# Patient Record
Sex: Male | Born: 1935 | Race: White | Hispanic: No | Smoking: Former smoker
Health system: Southern US, Community
[De-identification: ages and names within clinical notes are randomized; demographics above are authoritative.]

## PROBLEM LIST (undated history)

## (undated) DIAGNOSIS — R062 Wheezing: Secondary | ICD-10-CM

## (undated) DIAGNOSIS — R17 Unspecified jaundice: Secondary | ICD-10-CM

## (undated) DIAGNOSIS — E119 Type 2 diabetes mellitus without complications: Secondary | ICD-10-CM

## (undated) DIAGNOSIS — R778 Other specified abnormalities of plasma proteins: Secondary | ICD-10-CM

## (undated) DIAGNOSIS — R7989 Other specified abnormal findings of blood chemistry: Secondary | ICD-10-CM

## (undated) DIAGNOSIS — J189 Pneumonia, unspecified organism: Secondary | ICD-10-CM

## (undated) DIAGNOSIS — G9340 Encephalopathy, unspecified: Secondary | ICD-10-CM

## (undated) DIAGNOSIS — G629 Polyneuropathy, unspecified: Secondary | ICD-10-CM

## (undated) DIAGNOSIS — M545 Low back pain, unspecified: Secondary | ICD-10-CM

## (undated) DIAGNOSIS — I639 Cerebral infarction, unspecified: Secondary | ICD-10-CM

## (undated) DIAGNOSIS — E039 Hypothyroidism, unspecified: Secondary | ICD-10-CM

## (undated) DIAGNOSIS — C801 Malignant (primary) neoplasm, unspecified: Secondary | ICD-10-CM

## (undated) DIAGNOSIS — I219 Acute myocardial infarction, unspecified: Secondary | ICD-10-CM

## (undated) DIAGNOSIS — K219 Gastro-esophageal reflux disease without esophagitis: Secondary | ICD-10-CM

## (undated) DIAGNOSIS — M1712 Unilateral primary osteoarthritis, left knee: Secondary | ICD-10-CM

## (undated) DIAGNOSIS — F329 Major depressive disorder, single episode, unspecified: Secondary | ICD-10-CM

## (undated) DIAGNOSIS — F32A Depression, unspecified: Secondary | ICD-10-CM

## (undated) DIAGNOSIS — D649 Anemia, unspecified: Secondary | ICD-10-CM

## (undated) DIAGNOSIS — I10 Essential (primary) hypertension: Secondary | ICD-10-CM

## (undated) DIAGNOSIS — G8929 Other chronic pain: Secondary | ICD-10-CM

## (undated) DIAGNOSIS — I2510 Atherosclerotic heart disease of native coronary artery without angina pectoris: Secondary | ICD-10-CM

## (undated) HISTORY — DX: Cerebral infarction, unspecified: I63.9

## (undated) HISTORY — DX: Low back pain: M54.5

## (undated) HISTORY — DX: Atherosclerotic heart disease of native coronary artery without angina pectoris: I25.10

## (undated) HISTORY — DX: Unilateral primary osteoarthritis, left knee: M17.12

## (undated) HISTORY — DX: Essential (primary) hypertension: I10

## (undated) HISTORY — DX: Hypothyroidism, unspecified: E03.9

## (undated) HISTORY — DX: Acute myocardial infarction, unspecified: I21.9

## (undated) HISTORY — DX: Type 2 diabetes mellitus without complications: E11.9

## (undated) HISTORY — DX: Other chronic pain: G89.29

## (undated) HISTORY — DX: Low back pain, unspecified: M54.50

## (undated) HISTORY — DX: Polyneuropathy, unspecified: G62.9

## (undated) HISTORY — DX: Gastro-esophageal reflux disease without esophagitis: K21.9

---
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
## 1967-07-13 HISTORY — PX: KNEE ARTHROTOMY: SHX5881

## 1972-07-12 HISTORY — PX: BACK SURGERY: SHX140

## 1974-07-12 HISTORY — PX: HEMORRHOID SURGERY: SHX153

## 1987-07-13 DIAGNOSIS — I219 Acute myocardial infarction, unspecified: Secondary | ICD-10-CM

## 1987-07-13 HISTORY — DX: Acute myocardial infarction, unspecified: I21.9

## 1990-07-12 HISTORY — PX: STOMACH SURGERY: SHX791

## 1994-07-12 HISTORY — PX: CORONARY ARTERY BYPASS GRAFT: SHX141

## 1998-07-12 HISTORY — PX: CHOLECYSTECTOMY: SHX55

## 1998-10-14 ENCOUNTER — Encounter: Payer: Self-pay | Admitting: Internal Medicine

## 1998-10-14 ENCOUNTER — Inpatient Hospital Stay (HOSPITAL_COMMUNITY): Admission: EM | Admit: 1998-10-14 | Discharge: 1998-10-21 | Payer: Self-pay | Admitting: Emergency Medicine

## 1998-10-15 ENCOUNTER — Encounter: Payer: Self-pay | Admitting: Internal Medicine

## 1998-10-17 ENCOUNTER — Encounter: Payer: Self-pay | Admitting: Internal Medicine

## 1999-08-21 ENCOUNTER — Ambulatory Visit: Admission: RE | Admit: 1999-08-21 | Discharge: 1999-08-21 | Payer: Self-pay | Admitting: Internal Medicine

## 2000-07-12 DIAGNOSIS — I639 Cerebral infarction, unspecified: Secondary | ICD-10-CM

## 2000-07-12 HISTORY — DX: Cerebral infarction, unspecified: I63.9

## 2002-10-09 ENCOUNTER — Encounter: Payer: Self-pay | Admitting: Neurology

## 2002-10-09 ENCOUNTER — Ambulatory Visit (HOSPITAL_COMMUNITY): Admission: RE | Admit: 2002-10-09 | Discharge: 2002-10-09 | Payer: Self-pay | Admitting: Neurology

## 2003-05-29 ENCOUNTER — Encounter
Admission: RE | Admit: 2003-05-29 | Discharge: 2003-05-29 | Payer: Self-pay | Admitting: Physical Medicine and Rehabilitation

## 2004-05-29 ENCOUNTER — Encounter
Admission: RE | Admit: 2004-05-29 | Discharge: 2004-05-29 | Payer: Self-pay | Admitting: Physical Medicine and Rehabilitation

## 2005-11-08 ENCOUNTER — Ambulatory Visit: Payer: Self-pay | Admitting: Oncology

## 2006-02-04 ENCOUNTER — Encounter: Admission: RE | Admit: 2006-02-04 | Discharge: 2006-02-04 | Payer: Self-pay | Admitting: Neurology

## 2006-02-15 ENCOUNTER — Ambulatory Visit: Payer: Self-pay | Admitting: Oncology

## 2006-08-02 ENCOUNTER — Ambulatory Visit: Payer: Self-pay | Admitting: Oncology

## 2007-01-24 ENCOUNTER — Ambulatory Visit: Payer: Self-pay | Admitting: Oncology

## 2009-01-15 ENCOUNTER — Encounter (INDEPENDENT_AMBULATORY_CARE_PROVIDER_SITE_OTHER): Payer: Self-pay | Admitting: Oncology

## 2009-01-15 ENCOUNTER — Other Ambulatory Visit: Admission: RE | Admit: 2009-01-15 | Discharge: 2009-01-15 | Payer: Self-pay | Admitting: Oncology

## 2011-03-06 ENCOUNTER — Emergency Department (HOSPITAL_COMMUNITY): Payer: Medicare Other

## 2011-03-06 ENCOUNTER — Emergency Department (HOSPITAL_COMMUNITY)
Admission: EM | Admit: 2011-03-06 | Discharge: 2011-03-06 | Disposition: A | Discharge status: Home / Self Care | Payer: Medicare Other | Attending: Emergency Medicine | Admitting: Emergency Medicine

## 2011-03-06 DIAGNOSIS — M545 Low back pain, unspecified: Secondary | ICD-10-CM | POA: Insufficient documentation

## 2011-03-06 DIAGNOSIS — E785 Hyperlipidemia, unspecified: Secondary | ICD-10-CM | POA: Insufficient documentation

## 2011-03-06 DIAGNOSIS — E119 Type 2 diabetes mellitus without complications: Secondary | ICD-10-CM | POA: Insufficient documentation

## 2011-03-06 DIAGNOSIS — R209 Unspecified disturbances of skin sensation: Secondary | ICD-10-CM | POA: Insufficient documentation

## 2011-03-06 DIAGNOSIS — M546 Pain in thoracic spine: Secondary | ICD-10-CM | POA: Insufficient documentation

## 2011-03-06 DIAGNOSIS — R109 Unspecified abdominal pain: Secondary | ICD-10-CM | POA: Insufficient documentation

## 2011-03-06 DIAGNOSIS — E039 Hypothyroidism, unspecified: Secondary | ICD-10-CM | POA: Insufficient documentation

## 2011-03-06 DIAGNOSIS — Z79899 Other long term (current) drug therapy: Secondary | ICD-10-CM | POA: Insufficient documentation

## 2011-03-06 DIAGNOSIS — R079 Chest pain, unspecified: Secondary | ICD-10-CM | POA: Insufficient documentation

## 2011-03-06 LAB — CBC
MCH: 29.5 pg (ref 26.0–34.0)
MCHC: 33.7 g/dL (ref 30.0–36.0)
Platelets: 93 10*3/uL — ABNORMAL LOW (ref 150–400)
RDW: 17.2 % — ABNORMAL HIGH (ref 11.5–15.5)

## 2011-03-06 LAB — DIFFERENTIAL
Basophils Absolute: 0 10*3/uL (ref 0.0–0.1)
Basophils Relative: 0 % (ref 0–1)
Eosinophils Absolute: 0.1 10*3/uL (ref 0.0–0.7)
Eosinophils Relative: 2 % (ref 0–5)
Monocytes Absolute: 0.8 10*3/uL (ref 0.1–1.0)
Monocytes Relative: 17 % — ABNORMAL HIGH (ref 3–12)
Neutro Abs: 3 10*3/uL (ref 1.7–7.7)

## 2011-03-06 LAB — BASIC METABOLIC PANEL
Calcium: 8.8 mg/dL (ref 8.4–10.5)
GFR calc Af Amer: >60 mL/min (ref >60)
GFR calc non Af Amer: >60 mL/min (ref >60)
Potassium: 3.9 mEq/L (ref 3.5–5.1)
Sodium: 139 mEq/L (ref 135–145)

## 2011-03-06 LAB — APTT: aPTT: 31 seconds (ref 24–37)

## 2011-03-06 LAB — PROTIME-INR: INR: 1.21 (ref 0.00–1.49)

## 2011-03-06 MED ORDER — IOHEXOL 300 MG/ML  SOLN: 100.0000 mL | Freq: Once | INTRAMUSCULAR | Status: DC | PRN

## 2011-03-06 MED ORDER — IOHEXOL 300 MG/ML  SOLN
100.0000 mL | Freq: Once | INTRAMUSCULAR | Status: AC | PRN
  Administered 2011-03-06: 100 mL via INTRAVENOUS

## 2011-12-08 ENCOUNTER — Other Ambulatory Visit (HOSPITAL_COMMUNITY)
Admission: RE | Admit: 2011-12-08 | Discharge: 2011-12-08 | Disposition: A | Discharge status: Home / Self Care | Payer: Medicare Other | Source: Ambulatory Visit | Attending: Oncology | Admitting: Oncology

## 2011-12-08 DIAGNOSIS — D61818 Other pancytopenia: Secondary | ICD-10-CM | POA: Insufficient documentation

## 2011-12-08 DIAGNOSIS — D704 Cyclic neutropenia: Secondary | ICD-10-CM | POA: Insufficient documentation

## 2012-10-17 ENCOUNTER — Encounter (INDEPENDENT_AMBULATORY_CARE_PROVIDER_SITE_OTHER): Payer: Self-pay | Admitting: Ophthalmology

## 2012-10-20 ENCOUNTER — Encounter (INDEPENDENT_AMBULATORY_CARE_PROVIDER_SITE_OTHER): Payer: Medicare Other | Admitting: Ophthalmology

## 2012-10-20 DIAGNOSIS — H43819 Vitreous degeneration, unspecified eye: Secondary | ICD-10-CM

## 2012-10-20 DIAGNOSIS — H353290 Exudative age-related macular degeneration, unspecified eye, stage unspecified: Secondary | ICD-10-CM

## 2012-10-20 DIAGNOSIS — H3530 Unspecified macular degeneration: Secondary | ICD-10-CM

## 2012-11-07 ENCOUNTER — Encounter (HOSPITAL_COMMUNITY): Payer: Self-pay | Admitting: Pharmacist

## 2012-11-08 ENCOUNTER — Other Ambulatory Visit: Payer: Self-pay | Admitting: Physician Assistant

## 2012-11-08 ENCOUNTER — Encounter: Payer: Self-pay | Admitting: Physician Assistant

## 2012-11-08 DIAGNOSIS — I219 Acute myocardial infarction, unspecified: Secondary | ICD-10-CM | POA: Insufficient documentation

## 2012-11-08 DIAGNOSIS — G629 Polyneuropathy, unspecified: Secondary | ICD-10-CM | POA: Insufficient documentation

## 2012-11-08 DIAGNOSIS — E039 Hypothyroidism, unspecified: Secondary | ICD-10-CM | POA: Insufficient documentation

## 2012-11-08 DIAGNOSIS — G8929 Other chronic pain: Secondary | ICD-10-CM | POA: Insufficient documentation

## 2012-11-08 DIAGNOSIS — I2510 Atherosclerotic heart disease of native coronary artery without angina pectoris: Secondary | ICD-10-CM | POA: Insufficient documentation

## 2012-11-08 DIAGNOSIS — I639 Cerebral infarction, unspecified: Secondary | ICD-10-CM

## 2012-11-08 DIAGNOSIS — I10 Essential (primary) hypertension: Secondary | ICD-10-CM | POA: Insufficient documentation

## 2012-11-08 DIAGNOSIS — M1712 Unilateral primary osteoarthritis, left knee: Secondary | ICD-10-CM

## 2012-11-08 DIAGNOSIS — K219 Gastro-esophageal reflux disease without esophagitis: Secondary | ICD-10-CM | POA: Insufficient documentation

## 2012-11-08 NOTE — H&P (Signed)
TOTAL KNEE ADMISSION H&P  Patient is being admitted for left total knee arthroplasty.  Subjective:  Chief Complaint:left knee pain.  HPI: Ian Cruz, 79 y.o. male, has a history of pain and functional disability in the left knee due to arthritis and has failed non-surgical conservative treatments for greater than 12 weeks to includeNSAID's and/or analgesics, corticosteriod injections, flexibility and strengthening excercises, use of assistive devices, weight reduction as appropriate and activity modification.  Onset of symptoms was gradual, starting >10 years ago with gradually worsening course since that time. The patient noted prior procedures on the knee to include  menisectomy on the left knee(s).  Patient currently rates pain in the left knee(s) at 10 out of 10 with activity. Patient has night pain, worsening of pain with activity and weight bearing, pain that interferes with activities of daily living, crepitus and joint swelling.  Patient has evidence of subchondral sclerosis, periarticular osteophytes and joint space narrowing by imaging studies.  There is no active infection.  Patient Active Problem List   Diagnosis Date Noted  . Coronary artery disease   . Hypertension   . GERD (gastroesophageal reflux disease)   . Hypothyroid   . Stroke   . MI (myocardial infarction)   . Left knee DJD   . Neuropathy   . Chronic low back pain    Past Medical History  Diagnosis Date  . Diabetes   . Coronary artery disease   . Hypertension   . GERD (gastroesophageal reflux disease)   . Hypothyroid   . Stroke 2002  . MI (myocardial infarction) 1989  . Left knee DJD   . Neuropathy   . Chronic low back pain     Past Surgical History  Procedure Laterality Date  . Appendectomy  1946  . Back surgery  1974  . Hemorrhoid surgery  1976  . Stomach surgery  1992    cancer  . Coronary artery bypass graft  1996    3 vessel  . Cholecystectomy  2000  . Knee arthrotomy  1969    left knee  meniscus     (Not in a hospital admission) No Known Allergies   Current Outpatient Prescriptions on File Prior to Visit  Medication Sig Dispense Refill  . buPROPion (WELLBUTRIN XL) 150 MG 24 hr tablet Take 150 mg by mouth daily.      . celecoxib (CELEBREX) 200 MG capsule Take 200 mg by mouth 2 (two) times daily.      . clopidogrel (PLAVIX) 75 MG tablet Take 75 mg by mouth daily.      . ferrous sulfate 325 (65 FE) MG EC tablet Take 325 mg by mouth daily.      . finasteride (PROSCAR) 5 MG tablet Take 5 mg by mouth daily.      . furosemide (LASIX) 40 MG tablet Take 20-40 mg by mouth daily. Takes 20mg  one day, 40mg  the next, alternating days      . glimepiride (AMARYL) 2 MG tablet Take 2 mg by mouth daily before breakfast.      . levothyroxine (SYNTHROID, LEVOTHROID) 137 MCG tablet Take 137 mcg by mouth daily before breakfast.      . metFORMIN (GLUCOPHAGE) 500 MG tablet Take 500 mg by mouth 2 (two) times daily with a meal.      . metoprolol (LOPRESSOR) 50 MG tablet Take 50 mg by mouth daily.      . pantoprazole (PROTONIX) 40 MG tablet Take 40 mg by mouth daily.      Marland Kitchen  potassium chloride (K-DUR) 10 MEQ tablet Take 10 mEq by mouth daily.       No current facility-administered medications on file prior to visit.     History  Substance Use Topics  . Smoking status: Not on file  . Smokeless tobacco: Not on file  . Alcohol Use: Not on file    Family History  Problem Relation Age of Onset  . Heart disease Mother   . Heart disease Father   . Heart attack Father   . Hypertension Mother   . Stroke Mother   . Diabetes Father      Review of Systems  Constitutional: Negative.   HENT:       Dentures   Eyes: Positive for blurred vision.  Respiratory: Negative.   Cardiovascular: Negative.   Gastrointestinal: Negative.   Genitourinary: Negative.   Musculoskeletal: Positive for back pain and joint pain.  Neurological: Negative.   Endo/Heme/Allergies: Negative.    Psychiatric/Behavioral: Negative.     Objective:  Physical Exam  Constitutional: He is oriented to person, place, and time. He appears well-developed and well-nourished.  HENT:  Head: Normocephalic and atraumatic.  Eyes: Conjunctivae are normal. Pupils are equal, round, and reactive to light.  Neck: Normal range of motion.  Cardiovascular: Normal rate and regular rhythm.   Respiratory: Effort normal and breath sounds normal.  GI: Soft. Bowel sounds are normal.  Genitourinary:  Not pertinent to current symptomatology therefore not examined.  Musculoskeletal:  He is independently ambulatory with the assistance of a rolling walker.  Height: 5?9?Marland Kitchen  Weight: 200 pounds.  Left knee has active range of motion -5 to 120 degrees.  3+ crepitus.  Medial joint line tenderness.  Negative Lachman.  Negative valgus varus stress test.  Mild varus deformity.  Right knee has range of motion -5 to 120 degrees.  Medial joint line tenderness.  Varus deformity.  Moderate crepitus.  Distal neurovascular exam is intact.  He has 2+ dorsalis pedis pulses.    Neurological: He is alert and oriented to person, place, and time.  Skin: Skin is dry.  Psychiatric: He has a normal mood and affect.    Vital signs in last 24 hours: Last recorded: 04/30 1500   BP: 137/75 Pulse: 60  Temp: 98.2 F (36.8 C)    Height: 5\' 7"  (1.702 m) SpO2: 98  Weight: 93.441 kg (206 lb)     Labs:   Estimated body mass index is 32.26 kg/(m^2) as calculated from the following:   Height as of this encounter: 5\' 7"  (1.702 m).   Weight as of this encounter: 93.441 kg (206 lb).   Imaging Review Plain radiographs demonstrate severe degenerative joint disease of the left knee(s). The overall alignment issignificant varus. The bone quality appears to be good for age and reported activity level.  Assessment/Plan:  End stage arthritis, left knee   The patient history, physical examination, clinical judgment of the provider and  imaging studies are consistent with end stage degenerative joint disease of the left knee(s) and total knee arthroplasty is deemed medically necessary. The treatment options including medical management, injection therapy arthroscopy and arthroplasty were discussed at length. The risks and benefits of total knee arthroplasty were presented and reviewed. The risks due to aseptic loosening, infection, stiffness, patella tracking problems, thromboembolic complications and other imponderables were discussed. The patient acknowledged the explanation, agreed to proceed with the plan and consent was signed. Patient is being admitted for inpatient treatment for surgery, pain control, PT, OT, prophylactic antibiotics, VTE  prophylaxis, progressive ambulation and ADL's and discharge planning. The patient is planning to be discharged to skilled nursing facility

## 2012-11-10 ENCOUNTER — Encounter (HOSPITAL_COMMUNITY): Payer: Self-pay | Admitting: Vascular Surgery

## 2012-11-10 ENCOUNTER — Encounter (HOSPITAL_COMMUNITY): Payer: Self-pay

## 2012-11-10 ENCOUNTER — Encounter (HOSPITAL_COMMUNITY)
Admission: RE | Admit: 2012-11-10 | Discharge: 2012-11-10 | Disposition: A | Discharge status: Home / Self Care | Payer: Medicare Other | Source: Ambulatory Visit | Attending: Orthopedic Surgery | Admitting: Orthopedic Surgery

## 2012-11-10 ENCOUNTER — Encounter (HOSPITAL_COMMUNITY)
Admission: RE | Admit: 2012-11-10 | Discharge: 2012-11-10 | Disposition: A | Discharge status: Home / Self Care | Payer: Medicare Other | Source: Ambulatory Visit | Attending: Physician Assistant | Admitting: Physician Assistant

## 2012-11-10 DIAGNOSIS — K219 Gastro-esophageal reflux disease without esophagitis: Secondary | ICD-10-CM | POA: Insufficient documentation

## 2012-11-10 DIAGNOSIS — Z01812 Encounter for preprocedural laboratory examination: Secondary | ICD-10-CM | POA: Insufficient documentation

## 2012-11-10 DIAGNOSIS — Z01818 Encounter for other preprocedural examination: Secondary | ICD-10-CM | POA: Insufficient documentation

## 2012-11-10 DIAGNOSIS — M545 Low back pain, unspecified: Secondary | ICD-10-CM | POA: Insufficient documentation

## 2012-11-10 DIAGNOSIS — I10 Essential (primary) hypertension: Secondary | ICD-10-CM | POA: Insufficient documentation

## 2012-11-10 DIAGNOSIS — E119 Type 2 diabetes mellitus without complications: Secondary | ICD-10-CM | POA: Insufficient documentation

## 2012-11-10 DIAGNOSIS — I252 Old myocardial infarction: Secondary | ICD-10-CM | POA: Insufficient documentation

## 2012-11-10 DIAGNOSIS — Z538 Procedure and treatment not carried out for other reasons: Secondary | ICD-10-CM | POA: Insufficient documentation

## 2012-11-10 DIAGNOSIS — Q068 Other specified congenital malformations of spinal cord: Secondary | ICD-10-CM | POA: Insufficient documentation

## 2012-11-10 DIAGNOSIS — Z01810 Encounter for preprocedural cardiovascular examination: Secondary | ICD-10-CM | POA: Insufficient documentation

## 2012-11-10 DIAGNOSIS — I2510 Atherosclerotic heart disease of native coronary artery without angina pectoris: Secondary | ICD-10-CM | POA: Insufficient documentation

## 2012-11-10 HISTORY — DX: Pneumonia, unspecified organism: J18.9

## 2012-11-10 HISTORY — DX: Anemia, unspecified: D64.9

## 2012-11-10 HISTORY — DX: Major depressive disorder, single episode, unspecified: F32.9

## 2012-11-10 HISTORY — DX: Malignant (primary) neoplasm, unspecified: C80.1

## 2012-11-10 HISTORY — DX: Depression, unspecified: F32.A

## 2012-11-10 LAB — CBC WITH DIFFERENTIAL/PLATELET
Basophils Absolute: 0 10*3/uL (ref 0.0–0.1)
Basophils Relative: 0 % (ref 0–1)
Eosinophils Relative: 2 % (ref 0–5)
HCT: 36.1 % — ABNORMAL LOW (ref 39.0–52.0)
Hemoglobin: 12.4 g/dL — ABNORMAL LOW (ref 13.0–17.0)
MCH: 31 pg (ref 26.0–34.0)
MCHC: 34.3 g/dL (ref 30.0–36.0)
MCV: 90.3 fL (ref 78.0–100.0)
Monocytes Absolute: 0.4 10*3/uL (ref 0.1–1.0)
Monocytes Relative: 9 % (ref 3–12)
RDW: 14.8 % (ref 11.5–15.5)

## 2012-11-10 LAB — TYPE AND SCREEN: ABO/RH(D): A POS

## 2012-11-10 LAB — URINALYSIS, ROUTINE W REFLEX MICROSCOPIC
Bilirubin Urine: NEGATIVE
Glucose, UA: NEGATIVE mg/dL
Ketones, ur: NEGATIVE mg/dL
Protein, ur: NEGATIVE mg/dL
pH: 5.5 (ref 5.0–8.0)

## 2012-11-10 LAB — PROTIME-INR
INR: 1.11 (ref 0.00–1.49)
Prothrombin Time: 14.2 seconds (ref 11.6–15.2)

## 2012-11-10 LAB — APTT: aPTT: 31 seconds (ref 24–37)

## 2012-11-10 LAB — COMPREHENSIVE METABOLIC PANEL
AST: 34 U/L (ref 0–37)
Albumin: 3.4 g/dL — ABNORMAL LOW (ref 3.5–5.2)
BUN: 24 mg/dL — ABNORMAL HIGH (ref 6–23)
Calcium: 8.8 mg/dL (ref 8.4–10.5)
Chloride: 106 mEq/L (ref 96–112)
Creatinine, Ser: 0.87 mg/dL (ref 0.50–1.35)
GFR calc non Af Amer: 80 mL/min — ABNORMAL LOW (ref >90)
Total Bilirubin: 0.7 mg/dL (ref 0.3–1.2)

## 2012-11-10 LAB — ABO/RH: ABO/RH(D): A POS

## 2012-11-10 NOTE — Pre-Procedure Instructions (Addendum)
Ian Cruz  11/10/2012   Your procedure is scheduled on: Wednesday, Nov 22, 2012  Report to [facility] at 10:30 AM.  Call this number if you have problems the morning of surgery: [phone]   Remember:   Do not eat food or drink liquids after midnight.   Take these medicines the morning of surgery with A SIP OF WATER: buPROPion (WELLBUTRIN XL) 150 MG 24 hr tablet, finasteride (PROSCAR) 5 MG tablet               levothyroxine (SYNTHROID, LEVOTHROID) 137 MCG tablet, metoprolol (LOPRESSOR) 50 MG tablet , pantoprazole (PROTONIX) 40 MG tablet,  If needed:HYDROcodone-acetaminophen (NORCO/VICODIN) 5-325 MG     STOP celebrex, plavix per dr 11/17/12               Do not wear jewelry, make-up or nail polish.  Do not wear lotions, powders, or perfumes. You may wear deodorant.             Men may shave face and neck.  Contacts, dentures or bridgework may not be worn into surgery.  Do not bring valuables to the hospital.  Leave suitcase in the car. After surgery it may be brought to your room.  For patients admitted to the hospital, checkout time is 11:00 AM the day of discharge.   Patients discharged the day of surgery will not be allowed to drive home.  Name and phone number of your driver:   Special Instructions: Shower using CHG 2 nights before surgery and the night before surgery.  If you shower the day of surgery use CHG.  Use special wash - you have one bottle of CHG for all showers.  You should use approximately 1/3 of the bottle for each shower.   Please read over the following fact sheets that you were given: Pain Booklet, Coughing and Deep Breathing, Blood Transfusion Information and Surgical Site Infection Prevention

## 2012-11-10 NOTE — Progress Notes (Signed)
req'd notes, tests, ekg from [facility] cardiology [place] Ivan Croft

## 2012-11-11 LAB — URINE CULTURE

## 2012-11-13 NOTE — Progress Notes (Addendum)
Anesthesia chart review: Patient is a 79 year old male scheduled for left TKR by Dr. Thurston Hole on 11/22/2012. History includes former smoker, diabetes mellitus type 2, CAD/MI '89 with CABG '96, hypertension, CVA 2002, hypothyroidism, GERD, chronic low back pain with back surgery '74, depression, anemia, PNA, stomach cancer with surgery '92, appendectomy, cholecystectomy.  Myelodysplasia is also listed as a diagnosis per cardiology records.  PCP is Dr. Desmond Dike.    Cardiologist is Dr. Tomie China at [facility]) in [place].  Patient was seen for a preoperative evaluation on 10/24/12.  He ordered a nuclear stress test that was done on 10/30/12 and showed no ischemia.  Normal wall motion and thickening with EF 64%.  Dr. Tomie China felt that if negative, patient "is not at high risk for a coronary event during the aforementioned surgery."  He recommended continued b-blocker therapy and PRN cardiology follow-up.  EKG on 11/10/12 showed SR, low voltage QRS, inferior infarct (age undetermined), cannot rule out anterior infarct (age undetermined). His EKG appears stable since 10/24/12.    CXR on 11/10/12 showed no acute cardiopulmonary process.  Preoperative labs noted.  H/H 12.4/36.1, PLT 72K (previously 93K on 03/06/11).  Urine culture showed 20,000 colonies, none predominant.    I spoke with Julien Girt, PA-C regarding abnormal labs. She reports that patient is being seen by his Hematologist Dr. Melvyn Neth at [facility] today.  She will know more including if case will need to canceled/rescheduled once she gets additional input from Dr. Melvyn Neth.  She and Dr. Thurston Hole would prefer PLT count > 100K prior to surgery.    Velna Ochs [facility]/Anesthesiology Phone [phone] 11/13/2012 2:46 PM  Addendum: 11/17/12 1325 Patient was seen by Dr. Rennis Harding at the [facility] on 11/13/12.  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  By notes, his most recent  bone marrow biopsy in May 2013 showed no bone marrow disorders to explain the etiology behind his mild pancytopenia.  Overall, Dr. Melvyn Neth felt patient's recent lab results were stable.  He was planning to treat patient with Decadron 40 mg daily for four daily with plan to recheck his PLT count in 1 week.  If his PLT count is > 100,000, he felt patient could safely undergo TKR.  I will be out of town next week, so I will leave the chart for the short stay nurses to follow-up lab results from ~ 09/20/12. If no follow-up results received then patient will need a repeat CBC on the day of surgery to re-evaluate.

## 2012-11-17 ENCOUNTER — Encounter (INDEPENDENT_AMBULATORY_CARE_PROVIDER_SITE_OTHER): Payer: Medicare Other | Admitting: Ophthalmology

## 2012-11-17 DIAGNOSIS — H43819 Vitreous degeneration, unspecified eye: Secondary | ICD-10-CM

## 2012-11-17 DIAGNOSIS — H3530 Unspecified macular degeneration: Secondary | ICD-10-CM

## 2012-11-17 DIAGNOSIS — H353290 Exudative age-related macular degeneration, unspecified eye, stage unspecified: Secondary | ICD-10-CM

## 2012-11-21 NOTE — Progress Notes (Signed)
Pt notified of time change;to arrive at 0730 

## 2012-11-22 ENCOUNTER — Encounter (HOSPITAL_COMMUNITY): Admission: RE | Payer: Self-pay | Source: Ambulatory Visit

## 2012-11-22 ENCOUNTER — Inpatient Hospital Stay (HOSPITAL_COMMUNITY): Admission: RE | Admit: 2012-11-22 | Payer: Medicare Other | Source: Ambulatory Visit | Admitting: Orthopedic Surgery

## 2012-11-22 SURGERY — ARTHROPLASTY, KNEE, TOTAL
Anesthesia: General | Laterality: Left

## 2012-12-22 ENCOUNTER — Encounter (INDEPENDENT_AMBULATORY_CARE_PROVIDER_SITE_OTHER): Payer: Medicare Other | Admitting: Ophthalmology

## 2012-12-22 DIAGNOSIS — H353290 Exudative age-related macular degeneration, unspecified eye, stage unspecified: Secondary | ICD-10-CM

## 2012-12-22 DIAGNOSIS — H3530 Unspecified macular degeneration: Secondary | ICD-10-CM

## 2012-12-22 DIAGNOSIS — H43819 Vitreous degeneration, unspecified eye: Secondary | ICD-10-CM

## 2012-12-22 DIAGNOSIS — H35379 Puckering of macula, unspecified eye: Secondary | ICD-10-CM

## 2013-01-18 ENCOUNTER — Encounter (INDEPENDENT_AMBULATORY_CARE_PROVIDER_SITE_OTHER): Payer: Medicare Other | Admitting: Ophthalmology

## 2013-01-18 DIAGNOSIS — H353290 Exudative age-related macular degeneration, unspecified eye, stage unspecified: Secondary | ICD-10-CM

## 2013-01-18 DIAGNOSIS — E11319 Type 2 diabetes mellitus with unspecified diabetic retinopathy without macular edema: Secondary | ICD-10-CM

## 2013-01-18 DIAGNOSIS — H43819 Vitreous degeneration, unspecified eye: Secondary | ICD-10-CM

## 2013-01-18 DIAGNOSIS — E1139 Type 2 diabetes mellitus with other diabetic ophthalmic complication: Secondary | ICD-10-CM

## 2013-01-18 DIAGNOSIS — H3530 Unspecified macular degeneration: Secondary | ICD-10-CM

## 2013-02-15 ENCOUNTER — Encounter (INDEPENDENT_AMBULATORY_CARE_PROVIDER_SITE_OTHER): Payer: Medicare Other | Admitting: Ophthalmology

## 2013-02-15 DIAGNOSIS — E1165 Type 2 diabetes mellitus with hyperglycemia: Secondary | ICD-10-CM

## 2013-02-15 DIAGNOSIS — H3530 Unspecified macular degeneration: Secondary | ICD-10-CM

## 2013-02-15 DIAGNOSIS — E11319 Type 2 diabetes mellitus with unspecified diabetic retinopathy without macular edema: Secondary | ICD-10-CM

## 2013-02-15 DIAGNOSIS — H43819 Vitreous degeneration, unspecified eye: Secondary | ICD-10-CM

## 2013-02-15 DIAGNOSIS — H353290 Exudative age-related macular degeneration, unspecified eye, stage unspecified: Secondary | ICD-10-CM

## 2013-03-15 ENCOUNTER — Encounter (INDEPENDENT_AMBULATORY_CARE_PROVIDER_SITE_OTHER): Payer: Medicare Other | Admitting: Ophthalmology

## 2013-03-20 ENCOUNTER — Encounter (INDEPENDENT_AMBULATORY_CARE_PROVIDER_SITE_OTHER): Payer: Medicare Other | Admitting: Ophthalmology

## 2013-03-20 DIAGNOSIS — H353290 Exudative age-related macular degeneration, unspecified eye, stage unspecified: Secondary | ICD-10-CM

## 2013-03-20 DIAGNOSIS — H35039 Hypertensive retinopathy, unspecified eye: Secondary | ICD-10-CM

## 2013-03-20 DIAGNOSIS — H3530 Unspecified macular degeneration: Secondary | ICD-10-CM

## 2013-03-20 DIAGNOSIS — I10 Essential (primary) hypertension: Secondary | ICD-10-CM

## 2013-03-20 DIAGNOSIS — H43819 Vitreous degeneration, unspecified eye: Secondary | ICD-10-CM

## 2013-04-19 ENCOUNTER — Encounter (INDEPENDENT_AMBULATORY_CARE_PROVIDER_SITE_OTHER): Payer: Medicare Other | Admitting: Ophthalmology

## 2013-04-19 DIAGNOSIS — H353290 Exudative age-related macular degeneration, unspecified eye, stage unspecified: Secondary | ICD-10-CM

## 2013-04-19 DIAGNOSIS — H3530 Unspecified macular degeneration: Secondary | ICD-10-CM

## 2013-04-19 DIAGNOSIS — H43819 Vitreous degeneration, unspecified eye: Secondary | ICD-10-CM

## 2013-04-19 DIAGNOSIS — H35039 Hypertensive retinopathy, unspecified eye: Secondary | ICD-10-CM

## 2013-04-19 DIAGNOSIS — I10 Essential (primary) hypertension: Secondary | ICD-10-CM

## 2013-05-24 ENCOUNTER — Encounter (INDEPENDENT_AMBULATORY_CARE_PROVIDER_SITE_OTHER): Payer: Medicare Other | Admitting: Ophthalmology

## 2013-06-06 ENCOUNTER — Encounter (INDEPENDENT_AMBULATORY_CARE_PROVIDER_SITE_OTHER): Payer: Medicare Other | Admitting: Ophthalmology

## 2013-06-06 DIAGNOSIS — E1139 Type 2 diabetes mellitus with other diabetic ophthalmic complication: Secondary | ICD-10-CM

## 2013-06-06 DIAGNOSIS — H43819 Vitreous degeneration, unspecified eye: Secondary | ICD-10-CM

## 2013-06-06 DIAGNOSIS — H353290 Exudative age-related macular degeneration, unspecified eye, stage unspecified: Secondary | ICD-10-CM

## 2013-06-06 DIAGNOSIS — H3530 Unspecified macular degeneration: Secondary | ICD-10-CM

## 2013-06-06 DIAGNOSIS — E11319 Type 2 diabetes mellitus with unspecified diabetic retinopathy without macular edema: Secondary | ICD-10-CM

## 2013-07-25 ENCOUNTER — Encounter (INDEPENDENT_AMBULATORY_CARE_PROVIDER_SITE_OTHER): Payer: Medicare Other | Admitting: Ophthalmology

## 2013-07-25 DIAGNOSIS — H353290 Exudative age-related macular degeneration, unspecified eye, stage unspecified: Secondary | ICD-10-CM

## 2013-07-25 DIAGNOSIS — H3530 Unspecified macular degeneration: Secondary | ICD-10-CM

## 2013-09-12 ENCOUNTER — Encounter (INDEPENDENT_AMBULATORY_CARE_PROVIDER_SITE_OTHER): Payer: Medicare Other | Admitting: Ophthalmology

## 2013-09-12 DIAGNOSIS — E1165 Type 2 diabetes mellitus with hyperglycemia: Secondary | ICD-10-CM

## 2013-09-12 DIAGNOSIS — H35379 Puckering of macula, unspecified eye: Secondary | ICD-10-CM

## 2013-09-12 DIAGNOSIS — E1139 Type 2 diabetes mellitus with other diabetic ophthalmic complication: Secondary | ICD-10-CM

## 2013-09-12 DIAGNOSIS — H3530 Unspecified macular degeneration: Secondary | ICD-10-CM

## 2013-09-12 DIAGNOSIS — E11319 Type 2 diabetes mellitus with unspecified diabetic retinopathy without macular edema: Secondary | ICD-10-CM

## 2013-09-12 DIAGNOSIS — H353290 Exudative age-related macular degeneration, unspecified eye, stage unspecified: Secondary | ICD-10-CM

## 2013-09-12 DIAGNOSIS — H43819 Vitreous degeneration, unspecified eye: Secondary | ICD-10-CM

## 2013-10-31 ENCOUNTER — Encounter (INDEPENDENT_AMBULATORY_CARE_PROVIDER_SITE_OTHER): Payer: Medicare Other | Admitting: Ophthalmology

## 2013-10-31 DIAGNOSIS — E1165 Type 2 diabetes mellitus with hyperglycemia: Secondary | ICD-10-CM

## 2013-10-31 DIAGNOSIS — H3530 Unspecified macular degeneration: Secondary | ICD-10-CM

## 2013-10-31 DIAGNOSIS — H43819 Vitreous degeneration, unspecified eye: Secondary | ICD-10-CM

## 2013-10-31 DIAGNOSIS — E11319 Type 2 diabetes mellitus with unspecified diabetic retinopathy without macular edema: Secondary | ICD-10-CM

## 2013-10-31 DIAGNOSIS — E1139 Type 2 diabetes mellitus with other diabetic ophthalmic complication: Secondary | ICD-10-CM

## 2013-10-31 DIAGNOSIS — H353290 Exudative age-related macular degeneration, unspecified eye, stage unspecified: Secondary | ICD-10-CM

## 2013-12-26 ENCOUNTER — Encounter (INDEPENDENT_AMBULATORY_CARE_PROVIDER_SITE_OTHER): Payer: Medicare Other | Admitting: Ophthalmology

## 2013-12-26 DIAGNOSIS — H43819 Vitreous degeneration, unspecified eye: Secondary | ICD-10-CM

## 2013-12-26 DIAGNOSIS — E1139 Type 2 diabetes mellitus with other diabetic ophthalmic complication: Secondary | ICD-10-CM

## 2013-12-26 DIAGNOSIS — H353290 Exudative age-related macular degeneration, unspecified eye, stage unspecified: Secondary | ICD-10-CM

## 2013-12-26 DIAGNOSIS — E1165 Type 2 diabetes mellitus with hyperglycemia: Secondary | ICD-10-CM

## 2013-12-26 DIAGNOSIS — H3530 Unspecified macular degeneration: Secondary | ICD-10-CM

## 2013-12-26 DIAGNOSIS — E11319 Type 2 diabetes mellitus with unspecified diabetic retinopathy without macular edema: Secondary | ICD-10-CM

## 2014-02-20 ENCOUNTER — Encounter (INDEPENDENT_AMBULATORY_CARE_PROVIDER_SITE_OTHER): Payer: Medicare Other | Admitting: Ophthalmology

## 2014-02-20 DIAGNOSIS — H43819 Vitreous degeneration, unspecified eye: Secondary | ICD-10-CM

## 2014-02-20 DIAGNOSIS — E1165 Type 2 diabetes mellitus with hyperglycemia: Secondary | ICD-10-CM

## 2014-02-20 DIAGNOSIS — H353290 Exudative age-related macular degeneration, unspecified eye, stage unspecified: Secondary | ICD-10-CM

## 2014-02-20 DIAGNOSIS — E11319 Type 2 diabetes mellitus with unspecified diabetic retinopathy without macular edema: Secondary | ICD-10-CM

## 2014-02-20 DIAGNOSIS — E1139 Type 2 diabetes mellitus with other diabetic ophthalmic complication: Secondary | ICD-10-CM

## 2014-02-20 DIAGNOSIS — H3530 Unspecified macular degeneration: Secondary | ICD-10-CM

## 2014-04-17 ENCOUNTER — Encounter (INDEPENDENT_AMBULATORY_CARE_PROVIDER_SITE_OTHER): Payer: Medicare Other | Admitting: Ophthalmology

## 2014-04-17 DIAGNOSIS — E11329 Type 2 diabetes mellitus with mild nonproliferative diabetic retinopathy without macular edema: Secondary | ICD-10-CM

## 2014-04-17 DIAGNOSIS — H3532 Exudative age-related macular degeneration: Secondary | ICD-10-CM

## 2014-04-17 DIAGNOSIS — H43813 Vitreous degeneration, bilateral: Secondary | ICD-10-CM

## 2014-04-17 DIAGNOSIS — H3531 Nonexudative age-related macular degeneration: Secondary | ICD-10-CM

## 2014-04-17 DIAGNOSIS — E11319 Type 2 diabetes mellitus with unspecified diabetic retinopathy without macular edema: Secondary | ICD-10-CM

## 2014-06-19 ENCOUNTER — Encounter (INDEPENDENT_AMBULATORY_CARE_PROVIDER_SITE_OTHER): Payer: Medicare Other | Admitting: Ophthalmology

## 2014-06-19 DIAGNOSIS — E11319 Type 2 diabetes mellitus with unspecified diabetic retinopathy without macular edema: Secondary | ICD-10-CM

## 2014-06-19 DIAGNOSIS — E11329 Type 2 diabetes mellitus with mild nonproliferative diabetic retinopathy without macular edema: Secondary | ICD-10-CM

## 2014-06-19 DIAGNOSIS — H3532 Exudative age-related macular degeneration: Secondary | ICD-10-CM

## 2014-06-19 DIAGNOSIS — H43813 Vitreous degeneration, bilateral: Secondary | ICD-10-CM

## 2014-06-19 DIAGNOSIS — H3531 Nonexudative age-related macular degeneration: Secondary | ICD-10-CM

## 2014-07-31 ENCOUNTER — Encounter (INDEPENDENT_AMBULATORY_CARE_PROVIDER_SITE_OTHER): Payer: Medicare Other | Admitting: Ophthalmology

## 2014-09-18 ENCOUNTER — Encounter (INDEPENDENT_AMBULATORY_CARE_PROVIDER_SITE_OTHER): Payer: Medicare Other | Admitting: Ophthalmology

## 2014-09-18 DIAGNOSIS — E11319 Type 2 diabetes mellitus with unspecified diabetic retinopathy without macular edema: Secondary | ICD-10-CM | POA: Diagnosis not present

## 2014-09-18 DIAGNOSIS — H3532 Exudative age-related macular degeneration: Secondary | ICD-10-CM | POA: Diagnosis not present

## 2014-09-18 DIAGNOSIS — E11329 Type 2 diabetes mellitus with mild nonproliferative diabetic retinopathy without macular edema: Secondary | ICD-10-CM

## 2014-09-18 DIAGNOSIS — H43813 Vitreous degeneration, bilateral: Secondary | ICD-10-CM | POA: Diagnosis not present

## 2014-09-30 ENCOUNTER — Ambulatory Visit (INDEPENDENT_AMBULATORY_CARE_PROVIDER_SITE_OTHER): Payer: Medicare Other

## 2014-09-30 VITALS — BP 147/73 | HR 53 | Resp 18

## 2014-09-30 DIAGNOSIS — R609 Edema, unspecified: Secondary | ICD-10-CM

## 2014-09-30 DIAGNOSIS — M21371 Foot drop, right foot: Secondary | ICD-10-CM | POA: Diagnosis not present

## 2014-09-30 DIAGNOSIS — R269 Unspecified abnormalities of gait and mobility: Secondary | ICD-10-CM

## 2014-09-30 DIAGNOSIS — M21372 Foot drop, left foot: Secondary | ICD-10-CM | POA: Diagnosis not present

## 2014-09-30 DIAGNOSIS — E1140 Type 2 diabetes mellitus with diabetic neuropathy, unspecified: Secondary | ICD-10-CM | POA: Diagnosis not present

## 2014-09-30 DIAGNOSIS — M79676 Pain in unspecified toe(s): Secondary | ICD-10-CM | POA: Diagnosis not present

## 2014-09-30 DIAGNOSIS — G629 Polyneuropathy, unspecified: Secondary | ICD-10-CM | POA: Diagnosis not present

## 2014-09-30 DIAGNOSIS — B351 Tinea unguium: Secondary | ICD-10-CM | POA: Diagnosis not present

## 2014-09-30 NOTE — Patient Instructions (Signed)
Diabetes and Foot Care Diabetes may cause you to have problems because of poor blood supply (circulation) to your feet and legs. This may cause the skin on your feet to become thinner, break easier, and heal more slowly. Your skin may become dry, and the skin may peel and crack. You may also have nerve damage in your legs and feet causing decreased feeling in them. You may not notice minor injuries to your feet that could lead to infections or more serious problems. Taking care of your feet is one of the most important things you can do for yourself.  HOME CARE INSTRUCTIONS  Wear shoes at all times, even in the house. Do not go barefoot. Bare feet are easily injured.  Check your feet daily for blisters, cuts, and redness. If you cannot see the bottom of your feet, use a mirror or ask someone for help.  Wash your feet with warm water (do not use hot water) and mild soap. Then pat your feet and the areas between your toes until they are completely dry. Do not soak your feet as this can dry your skin.  Apply a moisturizing lotion or petroleum jelly (that does not contain alcohol and is unscented) to the skin on your feet and to dry, brittle toenails. Do not apply lotion between your toes.  Trim your toenails straight across. Do not dig under them or around the cuticle. File the edges of your nails with an emery board or nail file.  Do not cut corns or calluses or try to remove them with medicine.  Wear clean socks or stockings every day. Make sure they are not too tight. Do not wear knee-high stockings since they may decrease blood flow to your legs.  Wear shoes that fit properly and have enough cushioning. To break in new shoes, wear them for just a few hours a day. This prevents you from injuring your feet. Always look in your shoes before you put them on to be sure there are no objects inside.  Do not cross your legs. This may decrease the blood flow to your feet.  If you find a minor scrape,  cut, or break in the skin on your feet, keep it and the skin around it clean and dry. These areas may be cleansed with mild soap and water. Do not cleanse the area with peroxide, alcohol, or iodine.  When you remove an adhesive bandage, be sure not to damage the skin around it.  If you have a wound, look at it several times a day to make sure it is healing.  Do not use heating pads or hot water bottles. They may burn your skin. If you have lost feeling in your feet or legs, you may not know it is happening until it is too late.  Make sure your health care provider performs a complete foot exam at least annually or more often if you have foot problems. Report any cuts, sores, or bruises to your health care provider immediately. SEEK MEDICAL CARE IF:   You have an injury that is not healing.  You have cuts or breaks in the skin.  You have an ingrown nail.  You notice redness on your legs or feet.  You feel burning or tingling in your legs or feet.  You have pain or cramps in your legs and feet.  Your legs or feet are numb.  Your feet always feel cold. SEEK IMMEDIATE MEDICAL CARE IF:   There is increasing redness,   swelling, or pain in or around a wound.  There is a red line that goes up your leg.  Pus is coming from a wound.  You develop a fever or as directed by your health care provider.  You notice a bad smell coming from an ulcer or wound. Document Released: 06/25/2000 Document Revised: 02/28/2013 Document Reviewed: 12/05/2012 Exit[employer] Patient Information 2015 [employer]. This information is not intended to replace advice given to you by your health care provider. Make sure you discuss any questions you have with your health care provider.  

## 2014-09-30 NOTE — Progress Notes (Signed)
   Subjective:    Patient ID: Ian Cruz, male    DOB: July 15, 1930, 81 y.o.   MRN: [id number]  HPI i am here to get my toenails trimmed and check my feet and i can't bend my feet forward and i said something to my medical doctor and he didn't say anything just gave me some medicine for it    Review of Systems  Musculoskeletal:       Swelling   All other systems reviewed and are negative.      Objective:   Physical Exam 81 year old white male presents this time for initial visit has a history of gait abnormality with dropfoot deformity bilateral. Idiopathic neuropathy apparently however patient also does have diabetes with neuropathy history of back surgeries possible radiculopathy as result of that also history of stroke. Patient is of a walker and is double upright AFO braces. Objective findings as follows vascular status reveals thready pulses DP plus one PT 0 over 4+2 edema bilateral mild varicosities noted skin temperature warm to cool turgor diminished Refill time 4-5 seconds neurologically epicritic and proprioceptive sensations grossly diminished on Semmes Weinstein to the forefoot digits and arch patient not able dorsiflex or plantarflex the foot has significant motor function loss as well as sensory loss abnormalities to both feet. Does have DTRs not elicited plantar response cannot be determined are seen. Orthopedic exam reveals patient does have gait abnormality with dropfoot 40 bilateral walks with AFO braces bilateral. No open wounds no ulcers no secondary infections nails thick brittle Crumley friable dystrophic particular hallux nails bilateral right being most severe tender both on palpation and debridement friable discoloration separation from the nailbed the hallux right and hallux through fifth digit left show thickening yellowing discoloration friability consistent with onychomycosis this time mycotic nails likely debrided 6 in the presence of pain symptoms while she was  diabetes and complication neuropathy. There are no open wounds no secondary infections significant +2 edema and vascular compromise also noted.       Assessment & Plan:  Assessment diabetes with history peripheral neuropathy idiopathic neuropathy as well as gait abnormality dropfoot deformity is bilateral mycotic nails painful debrided hallux bilateral and second through fifth left thick brittle Crumley dystrophic friable incurvated nails debrided and the presence of pain symptoms while Ian Cruz diabetes and neuropathy. Maintain appropriate accommodative shoes AFO brace at all times return in 3 months for follow up with continued palliative care in the future.  Harriet Masson DPM

## 2014-11-13 ENCOUNTER — Encounter (INDEPENDENT_AMBULATORY_CARE_PROVIDER_SITE_OTHER): Payer: Medicare Other | Admitting: Ophthalmology

## 2014-11-13 DIAGNOSIS — H43813 Vitreous degeneration, bilateral: Secondary | ICD-10-CM

## 2014-11-13 DIAGNOSIS — E11329 Type 2 diabetes mellitus with mild nonproliferative diabetic retinopathy without macular edema: Secondary | ICD-10-CM | POA: Diagnosis not present

## 2014-11-13 DIAGNOSIS — E11319 Type 2 diabetes mellitus with unspecified diabetic retinopathy without macular edema: Secondary | ICD-10-CM

## 2014-11-13 DIAGNOSIS — H3531 Nonexudative age-related macular degeneration: Secondary | ICD-10-CM | POA: Diagnosis not present

## 2014-12-16 ENCOUNTER — Encounter (INDEPENDENT_AMBULATORY_CARE_PROVIDER_SITE_OTHER): Payer: Medicare Other | Admitting: Ophthalmology

## 2014-12-16 DIAGNOSIS — H3531 Nonexudative age-related macular degeneration: Secondary | ICD-10-CM

## 2014-12-16 DIAGNOSIS — H43813 Vitreous degeneration, bilateral: Secondary | ICD-10-CM

## 2015-01-06 ENCOUNTER — Ambulatory Visit: Payer: Medicare Other

## 2015-01-08 ENCOUNTER — Encounter (INDEPENDENT_AMBULATORY_CARE_PROVIDER_SITE_OTHER): Payer: Medicare Other | Admitting: Ophthalmology

## 2015-01-08 DIAGNOSIS — E11329 Type 2 diabetes mellitus with mild nonproliferative diabetic retinopathy without macular edema: Secondary | ICD-10-CM | POA: Diagnosis not present

## 2015-01-08 DIAGNOSIS — I10 Essential (primary) hypertension: Secondary | ICD-10-CM

## 2015-01-08 DIAGNOSIS — H3531 Nonexudative age-related macular degeneration: Secondary | ICD-10-CM | POA: Diagnosis not present

## 2015-01-08 DIAGNOSIS — E11319 Type 2 diabetes mellitus with unspecified diabetic retinopathy without macular edema: Secondary | ICD-10-CM

## 2015-01-08 DIAGNOSIS — H3532 Exudative age-related macular degeneration: Secondary | ICD-10-CM

## 2015-01-08 DIAGNOSIS — H43813 Vitreous degeneration, bilateral: Secondary | ICD-10-CM

## 2015-01-22 ENCOUNTER — Ambulatory Visit: Payer: Medicare Other | Admitting: Podiatry

## 2015-02-26 ENCOUNTER — Encounter (INDEPENDENT_AMBULATORY_CARE_PROVIDER_SITE_OTHER): Payer: Medicare Other | Admitting: Ophthalmology

## 2015-02-26 DIAGNOSIS — H3531 Nonexudative age-related macular degeneration: Secondary | ICD-10-CM | POA: Diagnosis not present

## 2015-02-26 DIAGNOSIS — H3532 Exudative age-related macular degeneration: Secondary | ICD-10-CM | POA: Diagnosis not present

## 2015-02-26 DIAGNOSIS — E10319 Type 1 diabetes mellitus with unspecified diabetic retinopathy without macular edema: Secondary | ICD-10-CM

## 2015-02-26 DIAGNOSIS — H43813 Vitreous degeneration, bilateral: Secondary | ICD-10-CM

## 2015-02-26 DIAGNOSIS — E11329 Type 2 diabetes mellitus with mild nonproliferative diabetic retinopathy without macular edema: Secondary | ICD-10-CM | POA: Diagnosis not present

## 2015-04-16 ENCOUNTER — Encounter (INDEPENDENT_AMBULATORY_CARE_PROVIDER_SITE_OTHER): Payer: Medicare Other | Admitting: Ophthalmology

## 2015-04-16 DIAGNOSIS — H353124 Nonexudative age-related macular degeneration, left eye, advanced atrophic with subfoveal involvement: Secondary | ICD-10-CM

## 2015-04-16 DIAGNOSIS — E113293 Type 2 diabetes mellitus with mild nonproliferative diabetic retinopathy without macular edema, bilateral: Secondary | ICD-10-CM | POA: Diagnosis not present

## 2015-04-16 DIAGNOSIS — H353211 Exudative age-related macular degeneration, right eye, with active choroidal neovascularization: Secondary | ICD-10-CM | POA: Diagnosis not present

## 2015-04-16 DIAGNOSIS — H43813 Vitreous degeneration, bilateral: Secondary | ICD-10-CM

## 2015-04-16 DIAGNOSIS — E11311 Type 2 diabetes mellitus with unspecified diabetic retinopathy with macular edema: Secondary | ICD-10-CM | POA: Diagnosis not present

## 2015-05-16 DIAGNOSIS — D61818 Other pancytopenia: Secondary | ICD-10-CM | POA: Diagnosis not present

## 2015-05-16 DIAGNOSIS — E538 Deficiency of other specified B group vitamins: Secondary | ICD-10-CM

## 2015-05-16 DIAGNOSIS — R161 Splenomegaly, not elsewhere classified: Secondary | ICD-10-CM

## 2015-05-16 DIAGNOSIS — K7460 Unspecified cirrhosis of liver: Secondary | ICD-10-CM

## 2015-06-04 ENCOUNTER — Encounter (INDEPENDENT_AMBULATORY_CARE_PROVIDER_SITE_OTHER): Payer: Medicare Other | Admitting: Ophthalmology

## 2015-06-04 DIAGNOSIS — H43813 Vitreous degeneration, bilateral: Secondary | ICD-10-CM | POA: Diagnosis not present

## 2015-06-04 DIAGNOSIS — H353124 Nonexudative age-related macular degeneration, left eye, advanced atrophic with subfoveal involvement: Secondary | ICD-10-CM | POA: Diagnosis not present

## 2015-06-04 DIAGNOSIS — E113211 Type 2 diabetes mellitus with mild nonproliferative diabetic retinopathy with macular edema, right eye: Secondary | ICD-10-CM

## 2015-06-04 DIAGNOSIS — E113292 Type 2 diabetes mellitus with mild nonproliferative diabetic retinopathy without macular edema, left eye: Secondary | ICD-10-CM | POA: Diagnosis not present

## 2015-06-04 DIAGNOSIS — E11311 Type 2 diabetes mellitus with unspecified diabetic retinopathy with macular edema: Secondary | ICD-10-CM | POA: Diagnosis not present

## 2015-06-04 DIAGNOSIS — H353211 Exudative age-related macular degeneration, right eye, with active choroidal neovascularization: Secondary | ICD-10-CM

## 2015-07-30 ENCOUNTER — Encounter (INDEPENDENT_AMBULATORY_CARE_PROVIDER_SITE_OTHER): Payer: Medicare Other | Admitting: Ophthalmology

## 2015-07-30 DIAGNOSIS — H353211 Exudative age-related macular degeneration, right eye, with active choroidal neovascularization: Secondary | ICD-10-CM | POA: Diagnosis not present

## 2015-07-30 DIAGNOSIS — H43813 Vitreous degeneration, bilateral: Secondary | ICD-10-CM

## 2015-07-30 DIAGNOSIS — E11319 Type 2 diabetes mellitus with unspecified diabetic retinopathy without macular edema: Secondary | ICD-10-CM | POA: Diagnosis not present

## 2015-07-30 DIAGNOSIS — E113211 Type 2 diabetes mellitus with mild nonproliferative diabetic retinopathy with macular edema, right eye: Secondary | ICD-10-CM | POA: Diagnosis not present

## 2015-07-30 DIAGNOSIS — H353122 Nonexudative age-related macular degeneration, left eye, intermediate dry stage: Secondary | ICD-10-CM

## 2015-07-30 DIAGNOSIS — E113292 Type 2 diabetes mellitus with mild nonproliferative diabetic retinopathy without macular edema, left eye: Secondary | ICD-10-CM

## 2015-07-30 DIAGNOSIS — H35373 Puckering of macula, bilateral: Secondary | ICD-10-CM | POA: Diagnosis not present

## 2015-09-25 ENCOUNTER — Encounter (INDEPENDENT_AMBULATORY_CARE_PROVIDER_SITE_OTHER): Payer: Medicare Other | Admitting: Ophthalmology

## 2015-09-25 DIAGNOSIS — H353122 Nonexudative age-related macular degeneration, left eye, intermediate dry stage: Secondary | ICD-10-CM

## 2015-09-25 DIAGNOSIS — E113211 Type 2 diabetes mellitus with mild nonproliferative diabetic retinopathy with macular edema, right eye: Secondary | ICD-10-CM

## 2015-09-25 DIAGNOSIS — E113292 Type 2 diabetes mellitus with mild nonproliferative diabetic retinopathy without macular edema, left eye: Secondary | ICD-10-CM | POA: Diagnosis not present

## 2015-09-25 DIAGNOSIS — E11311 Type 2 diabetes mellitus with unspecified diabetic retinopathy with macular edema: Secondary | ICD-10-CM | POA: Diagnosis not present

## 2015-09-25 DIAGNOSIS — H353211 Exudative age-related macular degeneration, right eye, with active choroidal neovascularization: Secondary | ICD-10-CM

## 2015-09-25 DIAGNOSIS — H43813 Vitreous degeneration, bilateral: Secondary | ICD-10-CM

## 2015-11-20 ENCOUNTER — Encounter (INDEPENDENT_AMBULATORY_CARE_PROVIDER_SITE_OTHER): Payer: Medicare Other | Admitting: Ophthalmology

## 2015-11-20 DIAGNOSIS — H353122 Nonexudative age-related macular degeneration, left eye, intermediate dry stage: Secondary | ICD-10-CM | POA: Diagnosis not present

## 2015-11-20 DIAGNOSIS — E11319 Type 2 diabetes mellitus with unspecified diabetic retinopathy without macular edema: Secondary | ICD-10-CM

## 2015-11-20 DIAGNOSIS — E113293 Type 2 diabetes mellitus with mild nonproliferative diabetic retinopathy without macular edema, bilateral: Secondary | ICD-10-CM | POA: Diagnosis not present

## 2015-11-20 DIAGNOSIS — H43813 Vitreous degeneration, bilateral: Secondary | ICD-10-CM | POA: Diagnosis not present

## 2015-11-20 DIAGNOSIS — H353211 Exudative age-related macular degeneration, right eye, with active choroidal neovascularization: Secondary | ICD-10-CM

## 2015-12-19 DIAGNOSIS — R161 Splenomegaly, not elsewhere classified: Secondary | ICD-10-CM | POA: Diagnosis not present

## 2015-12-19 DIAGNOSIS — D61818 Other pancytopenia: Secondary | ICD-10-CM | POA: Diagnosis not present

## 2015-12-19 DIAGNOSIS — K7460 Unspecified cirrhosis of liver: Secondary | ICD-10-CM | POA: Diagnosis not present

## 2016-01-29 ENCOUNTER — Encounter (INDEPENDENT_AMBULATORY_CARE_PROVIDER_SITE_OTHER): Payer: Medicare Other | Admitting: Ophthalmology

## 2016-01-29 DIAGNOSIS — H353122 Nonexudative age-related macular degeneration, left eye, intermediate dry stage: Secondary | ICD-10-CM

## 2016-01-29 DIAGNOSIS — H43813 Vitreous degeneration, bilateral: Secondary | ICD-10-CM | POA: Diagnosis not present

## 2016-01-29 DIAGNOSIS — E113292 Type 2 diabetes mellitus with mild nonproliferative diabetic retinopathy without macular edema, left eye: Secondary | ICD-10-CM | POA: Diagnosis not present

## 2016-01-29 DIAGNOSIS — H353211 Exudative age-related macular degeneration, right eye, with active choroidal neovascularization: Secondary | ICD-10-CM | POA: Diagnosis not present

## 2016-01-29 DIAGNOSIS — E113211 Type 2 diabetes mellitus with mild nonproliferative diabetic retinopathy with macular edema, right eye: Secondary | ICD-10-CM

## 2016-01-29 DIAGNOSIS — E11311 Type 2 diabetes mellitus with unspecified diabetic retinopathy with macular edema: Secondary | ICD-10-CM

## 2016-03-31 ENCOUNTER — Emergency Department (HOSPITAL_COMMUNITY): Payer: Medicare Other

## 2016-03-31 ENCOUNTER — Encounter (HOSPITAL_COMMUNITY): Payer: Self-pay

## 2016-03-31 ENCOUNTER — Inpatient Hospital Stay (HOSPITAL_COMMUNITY)
Admission: EM | Admit: 2016-03-31 | Discharge: 2016-04-05 | DRG: 442 | Disposition: A | Discharge status: Home Health | Payer: Medicare Other | Attending: Internal Medicine | Admitting: Internal Medicine

## 2016-03-31 DIAGNOSIS — M545 Low back pain: Secondary | ICD-10-CM | POA: Diagnosis not present

## 2016-03-31 DIAGNOSIS — C220 Liver cell carcinoma: Secondary | ICD-10-CM | POA: Diagnosis present

## 2016-03-31 DIAGNOSIS — G8929 Other chronic pain: Secondary | ICD-10-CM | POA: Diagnosis present

## 2016-03-31 DIAGNOSIS — E1142 Type 2 diabetes mellitus with diabetic polyneuropathy: Secondary | ICD-10-CM | POA: Diagnosis present

## 2016-03-31 DIAGNOSIS — E118 Type 2 diabetes mellitus with unspecified complications: Secondary | ICD-10-CM

## 2016-03-31 DIAGNOSIS — R17 Unspecified jaundice: Secondary | ICD-10-CM

## 2016-03-31 DIAGNOSIS — D649 Anemia, unspecified: Secondary | ICD-10-CM

## 2016-03-31 DIAGNOSIS — R911 Solitary pulmonary nodule: Secondary | ICD-10-CM | POA: Diagnosis present

## 2016-03-31 DIAGNOSIS — K7290 Hepatic failure, unspecified without coma: Secondary | ICD-10-CM | POA: Diagnosis present

## 2016-03-31 DIAGNOSIS — R4182 Altered mental status, unspecified: Secondary | ICD-10-CM

## 2016-03-31 DIAGNOSIS — K7682 Hepatic encephalopathy: Secondary | ICD-10-CM | POA: Diagnosis present

## 2016-03-31 DIAGNOSIS — Z79899 Other long term (current) drug therapy: Secondary | ICD-10-CM

## 2016-03-31 DIAGNOSIS — R001 Bradycardia, unspecified: Secondary | ICD-10-CM | POA: Diagnosis present

## 2016-03-31 DIAGNOSIS — W19XXXA Unspecified fall, initial encounter: Secondary | ICD-10-CM | POA: Diagnosis present

## 2016-03-31 DIAGNOSIS — R935 Abnormal findings on diagnostic imaging of other abdominal regions, including retroperitoneum: Secondary | ICD-10-CM | POA: Diagnosis present

## 2016-03-31 DIAGNOSIS — R7989 Other specified abnormal findings of blood chemistry: Secondary | ICD-10-CM

## 2016-03-31 DIAGNOSIS — G9340 Encephalopathy, unspecified: Secondary | ICD-10-CM

## 2016-03-31 DIAGNOSIS — E039 Hypothyroidism, unspecified: Secondary | ICD-10-CM | POA: Diagnosis present

## 2016-03-31 DIAGNOSIS — I8500 Esophageal varices without bleeding: Secondary | ICD-10-CM | POA: Diagnosis present

## 2016-03-31 DIAGNOSIS — I864 Gastric varices: Secondary | ICD-10-CM | POA: Diagnosis present

## 2016-03-31 DIAGNOSIS — IMO0002 Reserved for concepts with insufficient information to code with codable children: Secondary | ICD-10-CM | POA: Diagnosis present

## 2016-03-31 DIAGNOSIS — C799 Secondary malignant neoplasm of unspecified site: Secondary | ICD-10-CM

## 2016-03-31 DIAGNOSIS — I2510 Atherosclerotic heart disease of native coronary artery without angina pectoris: Secondary | ICD-10-CM | POA: Diagnosis present

## 2016-03-31 DIAGNOSIS — K7460 Unspecified cirrhosis of liver: Secondary | ICD-10-CM | POA: Diagnosis present

## 2016-03-31 DIAGNOSIS — Z951 Presence of aortocoronary bypass graft: Secondary | ICD-10-CM

## 2016-03-31 DIAGNOSIS — E119 Type 2 diabetes mellitus without complications: Secondary | ICD-10-CM

## 2016-03-31 DIAGNOSIS — Z23 Encounter for immunization: Secondary | ICD-10-CM

## 2016-03-31 DIAGNOSIS — R160 Hepatomegaly, not elsewhere classified: Secondary | ICD-10-CM

## 2016-03-31 DIAGNOSIS — E038 Other specified hypothyroidism: Secondary | ICD-10-CM | POA: Diagnosis present

## 2016-03-31 DIAGNOSIS — Z87891 Personal history of nicotine dependence: Secondary | ICD-10-CM

## 2016-03-31 DIAGNOSIS — I10 Essential (primary) hypertension: Secondary | ICD-10-CM | POA: Diagnosis present

## 2016-03-31 DIAGNOSIS — Y9222 Religious institution as the place of occurrence of the external cause: Secondary | ICD-10-CM

## 2016-03-31 DIAGNOSIS — K7200 Acute and subacute hepatic failure without coma: Principal | ICD-10-CM | POA: Diagnosis present

## 2016-03-31 DIAGNOSIS — Z8673 Personal history of transient ischemic attack (TIA), and cerebral infarction without residual deficits: Secondary | ICD-10-CM

## 2016-03-31 DIAGNOSIS — Z7984 Long term (current) use of oral hypoglycemic drugs: Secondary | ICD-10-CM

## 2016-03-31 DIAGNOSIS — R062 Wheezing: Secondary | ICD-10-CM

## 2016-03-31 DIAGNOSIS — Z993 Dependence on wheelchair: Secondary | ICD-10-CM

## 2016-03-31 DIAGNOSIS — F0390 Unspecified dementia without behavioral disturbance: Secondary | ICD-10-CM | POA: Diagnosis present

## 2016-03-31 DIAGNOSIS — K219 Gastro-esophageal reflux disease without esophagitis: Secondary | ICD-10-CM | POA: Diagnosis present

## 2016-03-31 DIAGNOSIS — R778 Other specified abnormalities of plasma proteins: Secondary | ICD-10-CM | POA: Diagnosis present

## 2016-03-31 DIAGNOSIS — C159 Malignant neoplasm of esophagus, unspecified: Secondary | ICD-10-CM | POA: Diagnosis present

## 2016-03-31 DIAGNOSIS — E1165 Type 2 diabetes mellitus with hyperglycemia: Secondary | ICD-10-CM | POA: Diagnosis present

## 2016-03-31 DIAGNOSIS — E876 Hypokalemia: Secondary | ICD-10-CM | POA: Diagnosis present

## 2016-03-31 DIAGNOSIS — Z7902 Long term (current) use of antithrombotics/antiplatelets: Secondary | ICD-10-CM

## 2016-03-31 DIAGNOSIS — I252 Old myocardial infarction: Secondary | ICD-10-CM

## 2016-03-31 DIAGNOSIS — C169 Malignant neoplasm of stomach, unspecified: Secondary | ICD-10-CM | POA: Diagnosis present

## 2016-03-31 HISTORY — DX: Other specified abnormal findings of blood chemistry: R79.89

## 2016-03-31 HISTORY — DX: Encephalopathy, unspecified: G93.40

## 2016-03-31 HISTORY — DX: Unspecified jaundice: R17

## 2016-03-31 HISTORY — DX: Wheezing: R06.2

## 2016-03-31 HISTORY — DX: Other specified abnormalities of plasma proteins: R77.8

## 2016-03-31 LAB — CBC WITH DIFFERENTIAL/PLATELET
BASOS PCT: 1 %
Basophils Absolute: 0 10*3/uL (ref 0.0–0.1)
EOS ABS: 0.1 10*3/uL (ref 0.0–0.7)
Eosinophils Relative: 3 %
HCT: 29.4 % — ABNORMAL LOW (ref 39.0–52.0)
HEMOGLOBIN: 9.4 g/dL — AB (ref 13.0–17.0)
Lymphocytes Relative: 19 %
Lymphs Abs: 0.4 10*3/uL — ABNORMAL LOW (ref 0.7–4.0)
MCH: 31.1 pg (ref 26.0–34.0)
MCHC: 32 g/dL (ref 30.0–36.0)
MCV: 97.4 fL (ref 78.0–100.0)
MONO ABS: 0.2 10*3/uL (ref 0.1–1.0)
Monocytes Relative: 10 %
NEUTROS PCT: 67 %
Neutro Abs: 1.5 10*3/uL — ABNORMAL LOW (ref 1.7–7.7)
Platelets: 70 10*3/uL — ABNORMAL LOW (ref 150–400)
RBC: 3.02 MIL/uL — ABNORMAL LOW (ref 4.22–5.81)
RDW: 16.9 % — AB (ref 11.5–15.5)
WBC: 2.2 10*3/uL — ABNORMAL LOW (ref 4.0–10.5)

## 2016-03-31 LAB — GLUCOSE, CAPILLARY: Glucose-Capillary: 198 mg/dL — ABNORMAL HIGH (ref 65–99)

## 2016-03-31 LAB — URINALYSIS, ROUTINE W REFLEX MICROSCOPIC
BILIRUBIN URINE: NEGATIVE
Glucose, UA: NEGATIVE mg/dL
KETONES UR: NEGATIVE mg/dL
Leukocytes, UA: NEGATIVE
NITRITE: NEGATIVE
Protein, ur: NEGATIVE mg/dL
Specific Gravity, Urine: 1.013 (ref 1.005–1.030)
pH: 6.5 (ref 5.0–8.0)

## 2016-03-31 LAB — URINE MICROSCOPIC-ADD ON

## 2016-03-31 LAB — COMPREHENSIVE METABOLIC PANEL
ALBUMIN: 2.6 g/dL — AB (ref 3.5–5.0)
ALK PHOS: 106 U/L (ref 38–126)
ALT: 18 U/L (ref 17–63)
ANION GAP: 4 — AB (ref 5–15)
AST: 42 U/L — ABNORMAL HIGH (ref 15–41)
BUN: 14 mg/dL (ref 6–20)
CALCIUM: 8.2 mg/dL — AB (ref 8.9–10.3)
CO2: 23 mmol/L (ref 22–32)
CREATININE: 1 mg/dL (ref 0.61–1.24)
Chloride: 114 mmol/L — ABNORMAL HIGH (ref 101–111)
GFR calc Af Amer: >60 mL/min (ref >60)
GFR calc non Af Amer: >60 mL/min (ref >60)
Glucose, Bld: 226 mg/dL — ABNORMAL HIGH (ref 65–99)
POTASSIUM: 3.5 mmol/L (ref 3.5–5.1)
SODIUM: 141 mmol/L (ref 135–145)
TOTAL PROTEIN: 5.3 g/dL — AB (ref 6.5–8.1)
Total Bilirubin: 1.7 mg/dL — ABNORMAL HIGH (ref 0.3–1.2)

## 2016-03-31 LAB — AMMONIA: AMMONIA: 63 umol/L — AB (ref 9–35)

## 2016-03-31 LAB — TROPONIN I
TROPONIN I: 0.06 ng/mL — AB (ref <0.03)
TROPONIN I: 0.06 ng/mL — AB (ref <0.03)

## 2016-03-31 LAB — VITAMIN B12: VITAMIN B 12: 337 pg/mL (ref 180–914)

## 2016-03-31 LAB — TSH: TSH: 0.583 u[IU]/mL (ref 0.350–4.500)

## 2016-03-31 LAB — POC OCCULT BLOOD, ED: FECAL OCCULT BLD: POSITIVE — AB

## 2016-03-31 LAB — MRSA PCR SCREENING: MRSA by PCR: NEGATIVE

## 2016-03-31 MED ORDER — ONDANSETRON HCL 4 MG PO TABS: 4.0000 mg | ORAL_TABLET | Freq: Four times a day (QID) | ORAL | Status: DC | PRN

## 2016-03-31 MED ORDER — POLYETHYLENE GLYCOL 3350 17 G PO PACK: 17.0000 g | PACK | Freq: Every day | ORAL | Status: DC | PRN

## 2016-03-31 MED ORDER — HYDROCODONE-ACETAMINOPHEN 5-325 MG PO TABS: 1.0000 | ORAL_TABLET | Freq: Four times a day (QID) | ORAL | Status: DC | PRN

## 2016-03-31 MED ORDER — INSULIN ASPART 100 UNIT/ML ~~LOC~~ SOLN: 0.0000 [IU] | Freq: Every day | SUBCUTANEOUS | Status: DC

## 2016-03-31 MED ORDER — ONDANSETRON HCL 4 MG/2ML IJ SOLN: 4.0000 mg | Freq: Four times a day (QID) | INTRAMUSCULAR | Status: DC | PRN

## 2016-03-31 MED ORDER — PANTOPRAZOLE SODIUM 40 MG PO TBEC
40.0000 mg | DELAYED_RELEASE_TABLET | Freq: Every day | ORAL | Status: DC
  Administered 2016-04-01 – 2016-04-05 (×5): 40 mg via ORAL
  Filled 2016-03-31 (×5): qty 1

## 2016-03-31 MED ORDER — INFLUENZA VAC SPLIT QUAD 0.5 ML IM SUSY
0.5000 mL | PREFILLED_SYRINGE | INTRAMUSCULAR | Status: AC
  Administered 2016-04-01: 0.5 mL via INTRAMUSCULAR
  Filled 2016-03-31: qty 0.5

## 2016-03-31 MED ORDER — FERROUS SULFATE 325 (65 FE) MG PO TABS
325.0000 mg | ORAL_TABLET | Freq: Every day | ORAL | Status: DC
  Administered 2016-04-01 – 2016-04-05 (×5): 325 mg via ORAL
  Filled 2016-03-31 (×5): qty 1

## 2016-03-31 MED ORDER — POLYETHYLENE GLYCOL 3350 17 G PO PACK
17.0000 g | PACK | Freq: Every day | ORAL | Status: DC
Start: 2016-03-31 — End: 2016-04-03
  Administered 2016-04-02: 17 g via ORAL
  Filled 2016-03-31 (×2): qty 1

## 2016-03-31 MED ORDER — ACETAMINOPHEN 325 MG PO TABS: 650.0000 mg | ORAL_TABLET | Freq: Four times a day (QID) | ORAL | Status: DC | PRN

## 2016-03-31 MED ORDER — SODIUM CHLORIDE 0.9 % IV SOLN: INTRAVENOUS | Status: AC

## 2016-03-31 MED ORDER — METOPROLOL TARTRATE 50 MG PO TABS
50.0000 mg | ORAL_TABLET | Freq: Every day | ORAL | Status: DC
  Administered 2016-04-01 – 2016-04-04 (×4): 50 mg via ORAL
  Filled 2016-03-31 (×4): qty 1

## 2016-03-31 MED ORDER — POTASSIUM CHLORIDE CRYS ER 10 MEQ PO TBCR
10.0000 meq | EXTENDED_RELEASE_TABLET | Freq: Every day | ORAL | Status: DC
Start: 2016-03-31 — End: 2016-04-05
  Administered 2016-04-01 – 2016-04-05 (×5): 10 meq via ORAL
  Filled 2016-03-31 (×8): qty 1

## 2016-03-31 MED ORDER — ACETAMINOPHEN 650 MG RE SUPP: 650.0000 mg | Freq: Four times a day (QID) | RECTAL | Status: DC | PRN

## 2016-03-31 MED ORDER — LACTULOSE 10 GM/15ML PO SOLN
30.0000 g | Freq: Once | ORAL | Status: AC
  Administered 2016-04-01: 30 g via ORAL
  Filled 2016-03-31: qty 45

## 2016-03-31 MED ORDER — SODIUM CHLORIDE 0.9% FLUSH
3.0000 mL | Freq: Two times a day (BID) | INTRAVENOUS | Status: DC
  Administered 2016-03-31 – 2016-04-02 (×4): 3 mL via INTRAVENOUS
  Administered 2016-04-02: 23:00:00 via INTRAVENOUS
  Administered 2016-04-03 – 2016-04-04 (×4): 3 mL via INTRAVENOUS

## 2016-03-31 MED ORDER — CLOPIDOGREL BISULFATE 75 MG PO TABS
75.0000 mg | ORAL_TABLET | Freq: Every day | ORAL | Status: DC
  Administered 2016-04-01 – 2016-04-05 (×5): 75 mg via ORAL
  Filled 2016-03-31 (×5): qty 1

## 2016-03-31 MED ORDER — INSULIN ASPART 100 UNIT/ML ~~LOC~~ SOLN
0.0000 [IU] | Freq: Three times a day (TID) | SUBCUTANEOUS | Status: DC
  Administered 2016-04-01 – 2016-04-02 (×2): 3 [IU] via SUBCUTANEOUS
  Administered 2016-04-02: 2 [IU] via SUBCUTANEOUS
  Administered 2016-04-03: 3 [IU] via SUBCUTANEOUS
  Administered 2016-04-03: 2 [IU] via SUBCUTANEOUS
  Administered 2016-04-03: 3 [IU] via SUBCUTANEOUS
  Administered 2016-04-04 – 2016-04-05 (×4): 2 [IU] via SUBCUTANEOUS

## 2016-03-31 MED ORDER — TAMSULOSIN HCL 0.4 MG PO CAPS
0.4000 mg | ORAL_CAPSULE | Freq: Every day | ORAL | Status: DC
  Administered 2016-04-01 – 2016-04-04 (×5): 0.4 mg via ORAL
  Filled 2016-03-31 (×5): qty 1

## 2016-03-31 MED ORDER — ALBUTEROL SULFATE (2.5 MG/3ML) 0.083% IN NEBU
2.5000 mg | INHALATION_SOLUTION | Freq: Four times a day (QID) | RESPIRATORY_TRACT | Status: AC
  Administered 2016-03-31 – 2016-04-01 (×3): 2.5 mg via RESPIRATORY_TRACT
  Filled 2016-03-31 (×3): qty 3

## 2016-03-31 MED ORDER — LEVOTHYROXINE SODIUM 137 MCG PO TABS
137.0000 ug | ORAL_TABLET | Freq: Every day | ORAL | Status: DC
  Administered 2016-04-01 – 2016-04-05 (×5): 137 ug via ORAL
  Filled 2016-03-31 (×5): qty 1

## 2016-03-31 NOTE — ED Triage Notes (Addendum)
Pt. Last seen normal on Sunday at nursing facility. Normally able to talk and help himself to the bathroom with a wheelchair. Responds to voice, denies pain, weakness, oriented to person, time, and place

## 2016-03-31 NOTE — ED Notes (Signed)
Troponin of 0.06

## 2016-03-31 NOTE — H&P (Signed)
History and Physical    Ian Cruz [id number] DOB: 1931-04-12 DOA: 03/31/2016  PCP: Carlus Pavlov, MD Patient coming from: facility  Chief Complaint: acute encephalopathy/anemia/elevated troponin  HPI: Ian Cruz is a 82 y.o. male with medical history significant for hypertension, stroke, hypothyroid, GERD, CAD, diabetes, dementia presents to the emergency Department chief complaint acute encephalopathy. Initial evaluation reveals elevated troponin, mildly elevated total bilirubin mildly elevated ammonia level.  Information is obtained from the patient and the son who is at the bedside noting that information from patient may be unreliable due to dementia. He states that 3 days ago he fell while at church. He was taken to [facility] emergency department status post fall and also complaining of acute encephalopathy. It was presumed meclizine contributed to this and  was discontinued. Reportedly he did well until 2 days ago when he gradually became more and more confused. This morning he would not speak and could not remember staff's name. He has no recollection of details but remembers falling while going to the bathroom at church. He does complain of a sore "tailbone". He denies any recent illness fever chills nausea vomiting diarrhea constipation. He does report some "dark" stool but denies bright red blood per rectum. He denies headache dizziness syncope or near-syncope. He denies chest pain palpitation does report some increased shortness of breath and wheezing over the last 2 days. As a former smoker but is not on oxygen at home. He reports his "daughter noticed the wheezing several days ago and got me some medicine".    ED Course: In emergency department he's afebrile hemodynamically stable with a heart rate below end of normal he is not hypoxic he is provided with IV fluids and encephalopathy improved  Review of Systems: As per HPI otherwise 10 point review of systems negative.    Ambulatory Status: Mostly wheelchair-bound lives at facility in assisted living may need higher level of care  Past Medical History:  Diagnosis Date  . Acute encephalopathy   . Anemia   . Anemia   . Cancer ([place])    stomach  . Chronic low back pain   . Coronary artery disease   . Depression   . Diabetes ([place])   . Elevated bilirubin   . Elevated troponin   . GERD (gastroesophageal reflux disease)   . Hypertension   . Hypothyroid   . Inspiratory wheeze on examination   . Left knee DJD   . MI (myocardial infarction) ([place]) 1989  . Neuropathy ([place])   . Pneumonia    hx  . Stroke [facility]) 2002    Past Surgical History:  Procedure Laterality Date  . APPENDECTOMY  1946  . BACK SURGERY  1974  . CHOLECYSTECTOMY  2000  . CORONARY ARTERY BYPASS GRAFT  1996   3 vessel  . [place]  . KNEE ARTHROTOMY  1969   left knee meniscus  . STOMACH SURGERY  1992   cancer    Social History   Social History  . Marital status: Widowed    Spouse name: N/A  . Number of children: N/A  . Years of education: N/A   Occupational History  . Not on file.   Social History Main Topics  . Smoking status: Former Smoker    Packs/day: 3.00    Years: 29.00    Types: Cigarettes    Quit date: 11/10/1972  . Smokeless tobacco: Never Used  . Alcohol use No  . Drug use: No  . Sexual activity: Not  on file   Other Topics Concern  . Not on file   Social History Narrative  . No narrative on file    No Known Allergies  Family History  Problem Relation Age of Onset  . Heart disease Mother   . Hypertension Mother   . Stroke Mother   . Heart disease Father   . Heart attack Father   . Diabetes Father     Prior to Admission medications   Medication Sig Start Date End Date Taking? Authorizing Provider  buPROPion (WELLBUTRIN XL) 150 MG 24 hr tablet Take 150 mg by mouth daily.   Yes Historical Provider, MD  ferrous sulfate 325 (65 FE) MG EC tablet Take 325 mg by mouth daily.   Yes  Historical Provider, MD  finasteride (PROSCAR) 5 MG tablet Take 5 mg by mouth daily.   Yes Historical Provider, MD  glimepiride (AMARYL) 2 MG tablet Take 2 mg by mouth daily before breakfast.   Yes Historical Provider, MD  levothyroxine (SYNTHROID, LEVOTHROID) 125 MCG tablet Take 125 mcg by mouth daily. 08/15/14  Yes Historical Provider, MD  nadolol (CORGARD) 40 MG tablet Take 20 mg by mouth daily.   Yes Historical Provider, MD  pantoprazole (PROTONIX) 40 MG tablet Take 40 mg by mouth daily.   Yes Historical Provider, MD  polyethylene glycol (MIRALAX / GLYCOLAX) packet Take 17 g by mouth daily.   Yes Historical Provider, MD  potassium chloride (K-DUR,KLOR-CON) 10 MEQ tablet  08/31/14  Yes Historical Provider, MD  potassium chloride (MICRO-K) 10 MEQ CR capsule Take 10 mEq by mouth daily.   Yes Historical Provider, MD  PROAIR RESPICLICK [id number] (90 BASE) MCG/ACT AEPB  07/29/14  Yes Historical Provider, MD  tamsulosin (FLOMAX) 0.4 MG CAPS capsule  08/31/14  Yes Historical Provider, MD  celecoxib (CELEBREX) 200 MG capsule Take 200 mg by mouth 2 (two) times daily.    Historical Provider, MD  CHERATUSSIN AC 100-10 MG/5ML syrup  07/26/14   Historical Provider, MD  clopidogrel (PLAVIX) 75 MG tablet Take 75 mg by mouth daily.    Historical Provider, MD  furosemide (LASIX) 80 MG tablet  08/23/14   Historical Provider, MD  HYDROcodone-acetaminophen (NORCO/VICODIN) 5-325 MG per tablet  10/05/12   Historical Provider, MD  levofloxacin (LEVAQUIN) 500 MG tablet Take 500 mg by mouth daily. 07/26/14   Historical Provider, MD  levothyroxine (SYNTHROID, LEVOTHROID) 137 MCG tablet Take 137 mcg by mouth daily before breakfast.    Historical Provider, MD  metFORMIN (GLUCOPHAGE) 500 MG tablet Take 500 mg by mouth 2 (two) times daily with a meal.    Historical Provider, MD  metoprolol (LOPRESSOR) 50 MG tablet Take 50 mg by mouth daily.    Historical Provider, MD  potassium chloride (K-DUR) 10 MEQ tablet Take 10 mEq by mouth daily.     Historical Provider, MD    Physical Exam: Vitals:   03/31/16 1400 03/31/16 1415 03/31/16 1430 03/31/16 1520  BP: 114/57 (!) 119/52 105/76 166/82  Pulse: (!) 51 (!) 52 (!) 51 (!) 50  Resp: 20 15 18 17   Temp:    97.5 F (36.4 C)  TempSrc:    Oral  SpO2: 100% 98% 100% 100%     General:  Appears calm and comfortable  Eyes:  PERRL, EOMI, normal lids, iris ENT:  grossly normal hearing, lips & tongue, Mucous membranes of his mouth are pink but dry Neck:  no LAD, masses or thyromegaly Cardiovascular:  Bradycardia but regular, no m/r/g. No LE edema.  Respiratory:  CTA bilaterally, no w/r/r. Normal respiratory effort. Abdomen:  soft, ntnd, NABS Skin:  no rash or induration seen on limited exam Musculoskeletal:  grossly normal tone BUE/BLE, good ROM, no bony abnormality sacral area tender to touch moves all extremities  Psychiatric:  grossly normal mood and affect, speech fluent and appropriate,  Neurologic:  CN 2-12 grossly intact, moves all extremities in coordinated fashion, sensation intact alert oriented to self and place only follows commands speech slow but clear  Labs on Admission: I have personally reviewed following labs and imaging studies  CBC:  Recent Labs Lab 03/31/16 1133  WBC 2.2*  NEUTROABS 1.5*  HGB 9.4*  HCT 29.4*  MCV 97.4  PLT 70*   Basic Metabolic Panel:  Recent Labs Lab 03/31/16 1133  NA 141  K 3.5  CL 114*  CO2 23  GLUCOSE 226*  BUN 14  CREATININE 1.00  CALCIUM 8.2*   GFR: CrCl cannot be calculated (Unknown ideal weight.). Liver Function Tests:  Recent Labs Lab 03/31/16 1133  AST 42*  ALT 18  ALKPHOS 106  BILITOT 1.7*  PROT 5.3*  ALBUMIN 2.6*   No results for input(s): LIPASE, AMYLASE in the last 168 hours.  Recent Labs Lab 03/31/16 1133  AMMONIA 63*   Coagulation Profile: No results for input(s): INR, PROTIME in the last 168 hours. Cardiac Enzymes:  Recent Labs Lab 03/31/16 1133  TROPONINI 0.06*   BNP (last 3  results) No results for input(s): PROBNP in the last 8760 hours. HbA1C: No results for input(s): HGBA1C in the last 72 hours. CBG: No results for input(s): GLUCAP in the last 168 hours. Lipid Profile: No results for input(s): CHOL, HDL, LDLCALC, TRIG, CHOLHDL, LDLDIRECT in the last 72 hours. Thyroid Function Tests: No results for input(s): TSH, T4TOTAL, FREET4, T3FREE, THYROIDAB in the last 72 hours. Anemia Panel: No results for input(s): VITAMINB12, FOLATE, FERRITIN, TIBC, IRON, RETICCTPCT in the last 72 hours. Urine analysis:    Component Value Date/Time   COLORURINE YELLOW 03/31/2016 [place] 03/31/2016 1308   LABSPEC 1.013 03/31/2016 1308   PHURINE 6.5 03/31/2016 1308   GLUCOSEU NEGATIVE 03/31/2016 1308   HGBUR SMALL (A) 03/31/2016 1308   BILIRUBINUR NEGATIVE 03/31/2016 1308   KETONESUR NEGATIVE 03/31/2016 1308   PROTEINUR NEGATIVE 03/31/2016 1308   UROBILINOGEN 1.0 11/10/2012 0951   NITRITE NEGATIVE 03/31/2016 1308   LEUKOCYTESUR NEGATIVE 03/31/2016 1308    Creatinine Clearance: CrCl cannot be calculated (Unknown ideal weight.).  Sepsis Labs: @LABRCNTIP (procalcitonin:4,lacticidven:4) )No results found for this or any previous visit (from the past 240 hour(s)).   Radiological Exams on Admission: Dg Chest 2 View  Result Date: 03/31/2016 CLINICAL DATA:  On[place] mental status today, history of diabetes and gastric carcinoma, fell several days ago EXAM: CHEST  2 VIEW COMPARISON:  Chest x-ray 03/28/2016 FINDINGS: No active infiltrate or effusion is seen. The lungs appear slightly better aerated. The heart is mildly enlarged and stable. Median sternotomy sutures are noted from prior CABG. IMPRESSION: No active lung disease. Electronically Signed   By: Ivar Drape M.D.   On: 03/31/2016 12:12   Dg Sacrum/coccyx  Result Date: 03/31/2016 CLINICAL DATA:  [place] several days ago with pain in the sacrococcygeal region, altered mental status today EXAM: SACRUM AND COCCYX  - 2+ VIEW COMPARISON:  CT abdomen pelvis of 12/02/2013 FINDINGS: The bones are diffusely osteopenic. The sacrococcygeal elements are in normal alignment. No acute sacral fracture is seen. The sacral foramina appear corticated in the SI joints  are unremarkable. The pelvic rami are intact. IMPRESSION: Osteopenia.  No acute fracture. Electronically Signed   By: Ivar Drape M.D.   On: 03/31/2016 12:13   Ct Head Wo Contrast  Result Date: 03/31/2016 CLINICAL DATA:  Altered mental status, fall 3 days ago, right lower extremity weakness EXAM: CT HEAD WITHOUT CONTRAST TECHNIQUE: Contiguous axial images were obtained from the base of the skull through the vertex without intravenous contrast. COMPARISON:  03/28/2016 FINDINGS: Brain: No intracranial hemorrhage, mass effect or midline shift. No acute cortical infarction. No mass lesion is noted on this unenhanced scan. Mild cerebral atrophy. Vascular: Mild atherosclerotic calcifications of carotid siphon. Skull: No skull fracture is noted. Sinuses/Orbits: No acute finding. Other: None. IMPRESSION: No acute intracranial abnormality. Atherosclerotic calcifications of carotid siphon again noted. Mild cerebral atrophy again noted. Electronically Signed   By: Lahoma Crocker M.D.   On: 03/31/2016 11:49    EKG: Independently reviewed. Sinus bradycardia Inferior infarct, old  Assessment/Plan Principal Problem:   Acute encephalopathy Active Problems:   Coronary artery disease   Hypertension   GERD (gastroesophageal reflux disease)   Hypothyroid   Chronic low back pain   Diabetes (HCC)   Elevated troponin   Elevated bilirubin   Inspiratory wheeze on examination   #1. Acute encephalopathy. Improved after IV fluids in the emergency department. Etiology uncertain. CT of the head without acute abnormality. No signs symptoms of infectious process. Hemoglobin 9.4. Labs from [place] 3 days ago indicate hemoglobin 10.6. Patient is on Plavix Celebrex. FOBT positive. Ammonia  level slightly elevated total bilirubin slightly elevated. Remote history EtOH.  Son states patient close to baseline at time of admission -Admit to telemetry -Continue gentle IV fluids -Will obtain hepatitis panel -We'll hold off on any lactulose for now I son reports patient close to baseline -Obtain a B-12 folate RPR -Hold any meds that are altering -Monitor  #2. Elevated troponin/CAD. Initial troponin 0.06. EKG without acute changes. Chest x-ray with no active lung disease Patient denies chest pain.  -Cycle troponin -Serial EKG -Likely able to follow up outpatient  3. Anemia. Hemoglobin 9.4. See #1. No signs symptoms of active bleeding. Patient unsure if he's ever had a colonoscopy. -Monitor hemoglobin -Hold Celebrex -Continue Plavix -Repeat hemoglobin in a.m. -  If Stable may be able to follow up outpatient  #4. Inspiratory wheeze on examination. Patient reports new for 1 week. Remote history of smoking. Chest x-ray with no active lung disease. Oxygen saturation level greater than 90% on room air -Scheduled nebulizers -Monitor oxygen saturation level  #5. Diabetes. Serum glucose 226 on admission. Home medications include Amaryl and metformin -We will hold oral agents for now -Obtain a hemoglobin A1c -Use sliding scale insulin for optimal control  #6.elevated total bili/elevated ammonia. Remote etoh hx -see #1 -hep panel  #7. Hypertension. Controlled in the emergency department. I medications include Corgard, Lasix, metoprolol -Hold Lasix for now -Resume metoprolol tomorrow with parameters -Monitor  #8. Bradycardia. EKG as noted above. Medications include beta blocker -Hold medication until tomorrow -Resume with parameters -Obtain a TSH  9. Hypothyroidism. -Obtain a TSH -Continue Synthroid    DVT prophylaxis: plavix Code Status: full  Family Communication: son at bedside  Disposition Plan: back to facility  Consults called: none  Admission status: obs     Radene Gunning MD [facility]  If 7PM-7AM, please contact night-coverage [email or web address] Password [email or web address]  03/31/2016, 5:21 PM

## 2016-03-31 NOTE — ED Notes (Signed)
Report attempted 

## 2016-03-31 NOTE — ED Provider Notes (Signed)
[facility] DEPT Provider Note   CSN: [id number] Arrival date & time: 03/31/16  1027     History   Chief Complaint Chief Complaint  Patient presents with  . Altered Mental Status    HPI Ian Cruz is a 82 y.o. male.  Patient brought in today from [facility] in [place] due to [place].  Spoke with the RN at the nursing home.  She reports that the patient began getting confused three days ago.  Confusion improved somewhat 2 days ago, but then again started worsening yesterday. She states that he went to the ED at [facility] three days ago and the confusion was thought to be secondary to Meclizine.  He was then taken off of the medication at that time, but the confusion has progressed even though he is now off the Meclizine.  She states that this morning he did not talk to her and did not know her name, which is unusual for him.  She also reports that the patient fell twice three days ago.  She is unsure if he hit his head or loss consciousness when he fell.  She states that the patient has not had a fever.  Patient is currently complaining of pain of the tailbone from the fall three days ago, but denies any other pain.        Past Medical History:  Diagnosis Date  . Anemia   . Cancer ([place])    stomach  . Chronic low back pain   . Coronary artery disease   . Depression   . Diabetes ([place])   . GERD (gastroesophageal reflux disease)   . Hypertension   . Hypothyroid   . Left knee DJD   . MI (myocardial infarction) ([place]) 1989  . Neuropathy ([place])   . Pneumonia    hx  . Stroke [facility]) 2002    Patient Active Problem List   Diagnosis Date Noted  . Coronary artery disease   . Hypertension   . GERD (gastroesophageal reflux disease)   . Hypothyroid   . Stroke ([place])   . MI (myocardial infarction) ([place])   . Left knee DJD   . Neuropathy ([place])   . Chronic low back pain     Past Surgical History:  Procedure Laterality Date  . APPENDECTOMY  1946  . BACK  SURGERY  1974  . CHOLECYSTECTOMY  2000  . CORONARY ARTERY BYPASS GRAFT  1996   3 vessel  . [place]  . KNEE ARTHROTOMY  1969   left knee meniscus  . STOMACH SURGERY  1992   cancer       Home Medications    Prior to Admission medications   Medication Sig Start Date End Date Taking? Authorizing Provider  buPROPion (WELLBUTRIN XL) 150 MG 24 hr tablet Take 150 mg by mouth daily.    Historical Provider, MD  celecoxib (CELEBREX) 200 MG capsule Take 200 mg by mouth 2 (two) times daily.    Historical Provider, MD  CHERATUSSIN AC 100-10 MG/5ML syrup  07/26/14   Historical Provider, MD  clopidogrel (PLAVIX) 75 MG tablet Take 75 mg by mouth daily.    Historical Provider, MD  ferrous sulfate 325 (65 FE) MG EC tablet Take 325 mg by mouth daily.    Historical Provider, MD  finasteride (PROSCAR) 5 MG tablet Take 5 mg by mouth daily.    Historical Provider, MD  furosemide (LASIX) 40 MG tablet Take 20-40 mg by mouth daily. Takes 20mg  one  day, 40mg  the next, alternating days    Historical Provider, MD  furosemide (LASIX) 80 MG tablet  08/23/14   Historical Provider, MD  glimepiride (AMARYL) 2 MG tablet Take 2 mg by mouth daily before breakfast.    Historical Provider, MD  HYDROcodone-acetaminophen (NORCO/VICODIN) 5-325 MG per tablet  10/05/12   Historical Provider, MD  levofloxacin (LEVAQUIN) 500 MG tablet Take 500 mg by mouth daily. 07/26/14   Historical Provider, MD  levothyroxine (SYNTHROID, LEVOTHROID) 125 MCG tablet Take 125 mcg by mouth daily. 08/15/14   Historical Provider, MD  levothyroxine (SYNTHROID, LEVOTHROID) 137 MCG tablet Take 137 mcg by mouth daily before breakfast.    Historical Provider, MD  metFORMIN (GLUCOPHAGE) 500 MG tablet Take 500 mg by mouth 2 (two) times daily with a meal.    Historical Provider, MD  metoprolol (LOPRESSOR) 50 MG tablet Take 50 mg by mouth daily.    Historical Provider, MD  pantoprazole (PROTONIX) 40 MG tablet Take 40 mg by mouth daily.    Historical  Provider, MD  potassium chloride (K-DUR) 10 MEQ tablet Take 10 mEq by mouth daily.    Historical Provider, MD  potassium chloride (K-DUR,KLOR-CON) 10 MEQ tablet  08/31/14   Historical Provider, MD  PROAIR RESPICLICK 123XX123 (90 BASE) MCG/ACT AEPB  07/29/14   Historical Provider, MD  tamsulosin (FLOMAX) 0.4 MG CAPS capsule  08/31/14   Historical Provider, MD    Family History Family History  Problem Relation Age of Onset  . Heart disease Mother   . Hypertension Mother   . Stroke Mother   . Heart disease Father   . Heart attack Father   . Diabetes Father     Social History Social History  Substance Use Topics  . Smoking status: Former Smoker    Packs/day: 3.00    Years: 29.00    Types: Cigarettes    Quit date: 11/10/1972  . Smokeless tobacco: Never Used  . Alcohol use No     Allergies   Review of patient's allergies indicates no known allergies.   Review of Systems Review of Systems  All other systems reviewed and are negative.    Physical Exam Updated Vital Signs BP (!) 141/57 (BP Location: Right Arm)   Pulse 60   Temp 98 F (36.7 C) (Oral)   Resp 18   Ht 5\' 10"  (1.778 m)   Wt 83.9 kg   SpO2 98%   BMI 26.54 kg/m   Physical Exam  Constitutional: He appears well-developed and well-nourished.  HENT:  Head: Normocephalic and atraumatic.  Mouth/Throat: Oropharynx is clear and moist.  Eyes: EOM are normal. Pupils are equal, round, and reactive to light.  Neck: Normal range of motion. Neck supple.  Cardiovascular: Regular rhythm and normal heart sounds.  Bradycardia present.   Pulmonary/Chest: Effort normal and breath sounds normal.  Genitourinary: Rectal exam shows guaiac positive stool.  Genitourinary Comments: Stool brown in color  Musculoskeletal: Normal range of motion.  Neurological: He is alert. He has normal strength. No cranial nerve deficit or sensory deficit.  Patient alert and orientated to person, place, and time  Skin: Skin is warm and dry.    Psychiatric: He has a normal mood and affect.  Nursing note and vitals reviewed.    ED Treatments / Results  Labs (all labs ordered are listed, but only abnormal results are displayed) Labs Reviewed  CBC WITH DIFFERENTIAL/PLATELET  COMPREHENSIVE METABOLIC PANEL  URINALYSIS, ROUTINE W REFLEX MICROSCOPIC (NOT AT [facility])  TROPONIN I  AMMONIA  CBG MONITORING, ED    EKG  EKG Interpretation  Date/Time:  Wednesday March 31 2016 10:41:41 EDT Ventricular Rate:  49 PR Interval:    QRS Duration: 107 QT Interval:  524 QTC Calculation: 474 R Axis:   5 Text Interpretation:  Sinus bradycardia Inferior infarct, old Confirmed by [facility] MD, JULIE ([id number]) on 03/31/2016 10:46:15 AM       Radiology No results found.  Procedures Procedures (including critical care time)  Medications Ordered in ED Medications - No data to display   Initial Impression / Assessment and Plan / ED Course  I have reviewed the triage vital signs and the nursing notes.  Pertinent labs & imaging results that were available during my care of the patient were reviewed by me and considered in my medical decision making (see chart for details).  Clinical Course    Final Clinical Impressions(s) / ED Diagnoses   Final diagnoses:  Altered mental status   Patient brought in today from [facility] due to [place].  Staff at NH report that he was very confused this morning and did not know their names, which is unusual for him.  Upon arrival in the ED he is alert to person, place, and time.  He is answering my questions appropriately.  Staff at NH report that this is a significant improvement from when they saw him this morning.  Head CT negative for acute findings.  CXR is negative. Labs showing elevated troponin.  No ischemic changes on EKG.  He denies any chest pain at this time.  Repeat troponin ordered.  Ammonia is mildly elevated.  Hemoglobin is 9.4.  Obtained records from [place] and hgb there was  10.3 three days ago.  Hemoccult is positive, but stool appears brown in color.  He does have a history of Stomach Cancer in 1992.  Patient admitted to [facility] for further management.   New Prescriptions New Prescriptions   No medications on file     Hyman Bible, PA-C 04/03/16 1500    Isla Pence, MD 04/06/16 1208

## 2016-04-01 ENCOUNTER — Observation Stay (HOSPITAL_COMMUNITY): Payer: Medicare Other

## 2016-04-01 DIAGNOSIS — E1165 Type 2 diabetes mellitus with hyperglycemia: Secondary | ICD-10-CM | POA: Diagnosis present

## 2016-04-01 DIAGNOSIS — C220 Liver cell carcinoma: Secondary | ICD-10-CM | POA: Diagnosis present

## 2016-04-01 DIAGNOSIS — E118 Type 2 diabetes mellitus with unspecified complications: Secondary | ICD-10-CM

## 2016-04-01 DIAGNOSIS — R4182 Altered mental status, unspecified: Secondary | ICD-10-CM | POA: Diagnosis present

## 2016-04-01 DIAGNOSIS — E038 Other specified hypothyroidism: Secondary | ICD-10-CM | POA: Diagnosis present

## 2016-04-01 DIAGNOSIS — I2510 Atherosclerotic heart disease of native coronary artery without angina pectoris: Secondary | ICD-10-CM | POA: Diagnosis not present

## 2016-04-01 DIAGNOSIS — R410 Disorientation, unspecified: Secondary | ICD-10-CM

## 2016-04-01 DIAGNOSIS — R7989 Other specified abnormal findings of blood chemistry: Secondary | ICD-10-CM | POA: Diagnosis not present

## 2016-04-01 DIAGNOSIS — IMO0002 Reserved for concepts with insufficient information to code with codable children: Secondary | ICD-10-CM | POA: Diagnosis present

## 2016-04-01 DIAGNOSIS — G9340 Encephalopathy, unspecified: Secondary | ICD-10-CM | POA: Diagnosis not present

## 2016-04-01 DIAGNOSIS — C169 Malignant neoplasm of stomach, unspecified: Secondary | ICD-10-CM

## 2016-04-01 DIAGNOSIS — C159 Malignant neoplasm of esophagus, unspecified: Secondary | ICD-10-CM | POA: Diagnosis present

## 2016-04-01 LAB — BASIC METABOLIC PANEL
Anion gap: 8 (ref 5–15)
BUN: 11 mg/dL (ref 6–20)
CALCIUM: 8.2 mg/dL — AB (ref 8.9–10.3)
CO2: 24 mmol/L (ref 22–32)
CREATININE: 0.94 mg/dL (ref 0.61–1.24)
Chloride: 111 mmol/L (ref 101–111)
GFR calc Af Amer: >60 mL/min (ref >60)
GLUCOSE: 93 mg/dL (ref 65–99)
Potassium: 3.2 mmol/L — ABNORMAL LOW (ref 3.5–5.1)
SODIUM: 143 mmol/L (ref 135–145)

## 2016-04-01 LAB — CBC
HCT: 28.8 % — ABNORMAL LOW (ref 39.0–52.0)
Hemoglobin: 9.4 g/dL — ABNORMAL LOW (ref 13.0–17.0)
MCH: 31.4 pg (ref 26.0–34.0)
MCHC: 32.6 g/dL (ref 30.0–36.0)
MCV: 96.3 fL (ref 78.0–100.0)
PLATELETS: 59 10*3/uL — AB (ref 150–400)
RBC: 2.99 MIL/uL — ABNORMAL LOW (ref 4.22–5.81)
RDW: 16.8 % — AB (ref 11.5–15.5)
WBC: 2.6 10*3/uL — AB (ref 4.0–10.5)

## 2016-04-01 LAB — AMMONIA: Ammonia: 89 umol/L — ABNORMAL HIGH (ref 9–35)

## 2016-04-01 LAB — TROPONIN I: TROPONIN I: 0.06 ng/mL — AB (ref <0.03)

## 2016-04-01 LAB — GLUCOSE, CAPILLARY
GLUCOSE-CAPILLARY: 135 mg/dL — AB (ref 65–99)
Glucose-Capillary: 105 mg/dL — ABNORMAL HIGH (ref 65–99)
Glucose-Capillary: 172 mg/dL — ABNORMAL HIGH (ref 65–99)
Glucose-Capillary: 99 mg/dL (ref 65–99)

## 2016-04-01 MED ORDER — IOPAMIDOL (ISOVUE-300) INJECTION 61%
15.0000 mL | INTRAVENOUS | Status: AC
  Administered 2016-04-01 (×2): 15 mL via ORAL

## 2016-04-01 MED ORDER — IOPAMIDOL (ISOVUE-300) INJECTION 61%
INTRAVENOUS | Status: AC
  Administered 2016-04-01: 100 mL
  Filled 2016-04-01: qty 100

## 2016-04-01 MED ORDER — POTASSIUM CHLORIDE CRYS ER 20 MEQ PO TBCR
40.0000 meq | EXTENDED_RELEASE_TABLET | Freq: Once | ORAL | Status: AC
  Administered 2016-04-01: 40 meq via ORAL
  Filled 2016-04-01: qty 2

## 2016-04-01 MED ORDER — NYSTATIN 100000 UNIT/GM EX POWD
Freq: Three times a day (TID) | CUTANEOUS | Status: DC
  Administered 2016-04-02 – 2016-04-05 (×11): via TOPICAL
  Filled 2016-04-01: qty 15

## 2016-04-01 MED ORDER — ALBUTEROL SULFATE (2.5 MG/3ML) 0.083% IN NEBU
2.5000 mg | INHALATION_SOLUTION | RESPIRATORY_TRACT | Status: DC | PRN
  Administered 2016-04-01 – 2016-04-04 (×4): 2.5 mg via RESPIRATORY_TRACT
  Filled 2016-04-01 (×4): qty 3

## 2016-04-01 NOTE — Clinical Social Work Note (Signed)
Clinical Social Work Assessment  Patient Details  Name: Ian Cruz MRN: [id number] Date of Birth: [birth year]  Date of referral:  04/01/16               Reason for consult:  Facility Placement                Permission sought to share information with:  Facility Sport and exercise psychologist, Family Supports Permission granted to share information::  Yes, Verbal Permission Granted  Name::     [name]  Agency::  [facility]  Relationship::  Daughter  Contact Information:  [phone]  Housing/Transportation Living arrangements for the past 2 months:  [place] of Information:  Patient, Adult Children, Facility Patient Interpreter Needed:  None Criminal Activity/Legal Involvement Pertinent to Current Situation/Hospitalization:  No - Comment as needed Significant Relationships:  Adult Children Lives with:  Facility Resident Do you feel safe going back to the place where you live?  Yes Need for family participation in patient care:  Yes (Comment)  Care giving concerns:  CSW received consult regarding discharge planning. CSW spoke with patient and patient's daughter. Patient reported that he came from [place] and will return there at discharge. CSW to continue to follow for discharge needs.    Social Worker assessment / plan:  CSW spoke with patient and patient's daughter concerning return to ALF. Patient's daughter would like [facility] to transport pt.   Employment status:  Retired Forensic scientist:  Medicare PT Recommendations:  [place] / Referral to community resources:     Patient/Family's Response to care:  Patient and patient's daughter report agreement with discharge plan.  Patient/Family's Understanding of and Emotional Response to Diagnosis, Current Treatment, and Prognosis:  No questions/ concerns reported at this time.   Emotional Assessment Appearance:  Appears stated  age Attitude/Demeanor/Rapport:  Other (Appropriate) Affect (typically observed):  Appropriate, Accepting Orientation:  Oriented to Self, Oriented to Situation, Oriented to Place, Oriented to  Time Alcohol / Substance use:  Not Applicable Psych involvement (Current and /or in the community):  No (Comment)  Discharge Needs  Concerns to be addressed:  Care Coordination Readmission within the last 30 days:  No Current discharge risk:  None Barriers to Discharge:  No Barriers Identified   Benard Halsted, [place] 04/01/2016, 11:38 AM

## 2016-04-01 NOTE — Progress Notes (Signed)
PROGRESS NOTE    Ian Cruz  [id number] DOB: [birth year] DOA: 03/31/2016 PCP: Carlus Pavlov, MD     Brief Narrative:   82 y.o. WM PMHx Dementia, Gastric cancer (possible metastases seen on CT abdomen 03/06/2011 ), left lung nodule (possible metastasis),HTN, MI, CAD native artery, CVA, Hypothyroid, GERD, CAD, DM Type 2, dementia   Presents to the emergency Department chief complaint acute encephalopathy. Initial evaluation reveals elevated troponin, mildly elevated total bilirubin mildly elevated ammonia level.  Information is obtained from the patient and the son who is at the bedside noting that information from patient may be unreliable due to dementia. He states that 3 days ago he fell while at church. He was taken to [facility] emergency department status post fall and also complaining of acute encephalopathy. It was presumed meclizine contributed to this and  was discontinued. Reportedly he did well until 2 days ago when he gradually became more and more confused. This morning he would not speak and could not remember staff's name. He has no recollection of details but remembers falling while going to the bathroom at church. He does complain of a sore "tailbone". He denies any recent illness fever chills nausea vomiting diarrhea constipation. He does report some "dark" stool but denies bright red blood per rectum. He denies headache dizziness syncope or near-syncope. He denies chest pain palpitation does report some increased shortness of breath and wheezing over the last 2 days. As a former smoker but is not on oxygen at home. He reports his "daughter noticed the wheezing several days ago and got me some medicine".   Subjective: 9/21 A/O 3 (does not know when). States feels improved from admission. 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 - Improved after IV fluids in the emergency department. Etiology uncertain.  -CT of the head without acute abnormality. No signs symptoms of infectious process.  -Hemoglobin 9.4. Labs from [place] 3 days ago indicate hemoglobin 10.6.  -Occult Blood positive.  -Acute Hepatitis panel tending -B-12, folate, RPR pending -Hold any meds that are altering -A/O 3 (does not know when), follows commands  Elevated troponin/CAD native artery. - Initial troponin 0.06. EKG without acute changes. Chest x-ray with no active lung disease -Patient denies chest pain.  -Cycle troponin  Increased Ammonia level -Continues to climb. Acute hepatitis vs metastatic cancer? Patient not hypotensive at any point therefore not demand ischemia. Recent Labs Lab 03/31/16 1133 04/01/16 0438  AMMONIA 63* 89*   Anemia.  Hemoglobin 9.4. See Encephalopathy, gastric cancer.  -No signs symptoms of active bleeding.  Trend hemoglobin  -Patient unsure if he's ever had a colonoscopy. -Monitor hemoglobin -Hold Celebrex -Continue Plavix  DM Type 2 uncontrolled with complication -123456, lipid panel pending -Home medications include Amaryl and metformin: hold oral agents for now -Moderate SSI  Gastric/Esophageal cancer/Hepatocellular Carcinoma -Mildly elevated  T bili and ammonia, 02/2014 CT abdomen and pelvis/chest showed possible cancer/metastatic cancer which does not appear to have been follow up on. Obtain CT  chest/abdomen and pelvis. -CT abdomen pelvis chest: Worrisome for Hepatocellular carcinoma see results below. Alpha-fetoprotein pending -Consult GI on 9/22 EGD for biopsy? Liver biopsy?  Hypertension.  -Metoprolol 50 mg daily   Bradycardia. -Asymptomatic, continue current medication. Would not increase AV nodal blocking agent.   Hypothyroidism. -Obtain a TSH -Synthroid 137 g daily  Hypokalemia -K-Dur 40 mEq     DVT prophylaxis: SCD Code Status: Full Family Communication: None Disposition Plan: Dependent upon findings   Consultants:     Procedures/Significant Events:  CT chest/abdomen and pelvis 03/06/2011 -Abnormal circumferential wall thickening in the distal esophagus with a para-esophageal lymphadenopathy and edema/stranding (Hx gastric cancer) but there is evidence of distal gastrectomy suggesting that this may not be the reported gastric neoplasm.  Esophageal cancer?  -7 mm left lower lobe pulmonary nodule.  Metastatic disease ?  -Mild lymphadenopathy gastrohepatic ligament associated with abnormal soft tissue nodularity in the omentum. Imaging features are concerning for metastatic disease.   -PET CT may prove helpful to further evaluate. 9/20 CT head WO contrast: Negative acute infarct. 9/21 CT chest abdomen pelvis W contrast:,-Subcarinal lymph node 1.9 cm short axis dimension.- Lesion which appears to be a mass along the distal esophagus near the gastroesophageal junction is multiple varices as seen on the prior CT abdomen and pelvis.  - The liver is markedly shrunken with a nodular border consistent with cirrhosis.  -Multiple esophageal and gastric varices -lesion in the dome of the liver which is hypoattenuating and measures 3.4 cm AP by 3.4 cm transverse  by 2.8 cm craniocaudal (worrisome for hepatocellular carcinoma). A subtle focus of hyperattenuation in this location on the prior CT abdomen and pelvis measured 1.7 cm. -Spleen: splenomegaly:15 cm  craniocaudal. -thickening of the walls of the ascending colon likely related to the patient's cirrhosis.  - Mild anterior,inferior endplate compression fracture of T11 and biconcave compression fracture of T12 are unchanged.    Cultures 9/20 MRSA by PCR negative  Antimicrobials: None   Devices    LINES / TUBES:  None    Continuous Infusions:    Objective: Vitals:   04/01/16 0529 04/01/16 0725 04/01/16 1104 04/01/16 1502  BP: (!) 118/40  (!) 140/53 (!) 131/45  Pulse: (!) 55 (!) 55 60 (!) 54  Resp: 17 18  18   Temp: 97.7 F (36.5 C)   98.1 F (36.7 C)  TempSrc: Axillary   Oral  SpO2: 100%   100%  Weight:      Height:        Intake/Output Summary (Last 24 hours) at 04/01/16 1925 Last data filed at 04/01/16 1356  Gross per 24 hour  Intake             1240 ml  Output              450 ml  Net              790 ml   Filed Weights   03/31/16 1902  Weight: 83.9 kg (185 lb)    Examination:  General:A/O 3 (does not know when), follows commands, No acute respiratory distress Eyes: negative scleral hemorrhage, negative anisocoria, negative icterus ENT: Negative Runny nose, negative gingival bleeding, Neck:  Negative scars, masses, torticollis, lymphadenopathy, JVD Lungs: Clear to auscultation bilaterally without wheezes or crackles Cardiovascular: Regular rate and rhythm without murmur gallop or rub normal  S1 and S2 Abdomen: negative abdominal pain, nondistended, positive soft, bowel sounds, no rebound, no ascites, no appreciable mass Extremities: No significant cyanosis, clubbing, or edema bilateral lower extremities Skin: Negative rashes, lesions, ulcers Psychiatric:  Negative depression, negative anxiety, negative fatigue, negative mania  Central nervous system:  Cranial nerves II through XII intact, tongue/uvula midline, all extremities muscle strength 5/5, sensation intact throughout, negative dysarthria, negative expressive aphasia, negative receptive  aphasia.  .     Data Reviewed: Care during the described time interval was provided by me .  I have reviewed this patient's available data, including medical history, events of note, physical examination, and all test results as part of my evaluation. I have personally reviewed and interpreted all radiology studies.  CBC:  Recent Labs Lab 03/31/16 1133 04/01/16 0438  WBC 2.2* 2.6*  NEUTROABS 1.5*  --   HGB 9.4* 9.4*  HCT 29.4* 28.8*  MCV 97.4 96.3  PLT 70* 59*   Basic Metabolic Panel:  Recent Labs Lab 03/31/16 1133 04/01/16 0438  NA 141 143  K 3.5 3.2*  CL 114* 111  CO2 23 24  GLUCOSE 226* 93  BUN 14 11  CREATININE 1.00 0.94  CALCIUM 8.2* 8.2*   GFR: Estimated Creatinine Clearance: 61.5 mL/min (by C-G formula based on SCr of 0.94 mg/dL). Liver Function Tests:  Recent Labs Lab 03/31/16 1133  AST 42*  ALT 18  ALKPHOS 106  BILITOT 1.7*  PROT 5.3*  ALBUMIN 2.6*   No results for input(s): LIPASE, AMYLASE in the last 168 hours.  Recent Labs Lab 03/31/16 1133 04/01/16 0438  AMMONIA 63* 89*   Coagulation Profile: No results for input(s): INR, PROTIME in the last 168 hours. Cardiac Enzymes:  Recent Labs Lab 03/31/16 1133 03/31/16 1911 04/01/16 0438  TROPONINI 0.06* 0.06* 0.06*   BNP (last 3 results) No results for input(s): PROBNP in the last 8760 hours. HbA1C: No results for input(s): HGBA1C in the last 72 hours. CBG:  Recent Labs Lab 03/31/16 2257 04/01/16 0820 04/01/16 1212 04/01/16 1719  GLUCAP 198* 105* 172* 99   Lipid Profile: No results for input(s): CHOL, HDL, LDLCALC, TRIG, CHOLHDL, LDLDIRECT in the last 72 hours. Thyroid Function Tests:  Recent Labs  03/31/16 1911  TSH 0.583   Anemia Panel:  Recent Labs  03/31/16 1911  VITAMINB12 337   Sepsis Labs: No results for input(s): PROCALCITON, LATICACIDVEN in the last 168 hours.  Recent Results (from the past 240 hour(s))  MRSA PCR Screening     Status: None   Collection  Time: 03/31/16  8:45 PM  Result Value Ref Range Status   MRSA by PCR NEGATIVE NEGATIVE Final    Comment:        The GeneXpert MRSA Assay (FDA approved for NASAL specimens only), is one component of a comprehensive MRSA colonization surveillance program. It is not intended to diagnose MRSA infection nor to guide or monitor treatment for MRSA infections.          Radiology Studies: Dg Chest 2 View  Result Date: 03/31/2016 CLINICAL DATA:  On[place] mental status today, history of diabetes and gastric carcinoma, fell several days ago EXAM: CHEST  2 VIEW COMPARISON:  Chest x-ray 03/28/2016 FINDINGS: No active infiltrate or effusion is seen. The lungs appear slightly better aerated. The heart is mildly enlarged and stable. Median sternotomy sutures are noted from prior CABG. IMPRESSION: No active lung disease. Electronically Signed   By: Ivar Drape M.D.   On: 03/31/2016 12:12   Dg  Sacrum/coccyx  Result Date: 03/31/2016 CLINICAL DATA:  [place] several days ago with pain in the sacrococcygeal region, altered mental status today EXAM: SACRUM AND COCCYX - 2+ VIEW COMPARISON:  CT abdomen pelvis of 12/02/2013 FINDINGS: The bones are diffusely osteopenic. The sacrococcygeal elements are in normal alignment. No acute sacral fracture is seen. The sacral foramina appear corticated in the SI joints are unremarkable. The pelvic rami are intact. IMPRESSION: Osteopenia.  No acute fracture. Electronically Signed   By: Ivar Drape M.D.   On: 03/31/2016 12:13   Ct Head Wo Contrast  Result Date: 03/31/2016 CLINICAL DATA:  Altered mental status, fall 3 days ago, right lower extremity weakness EXAM: CT HEAD WITHOUT CONTRAST TECHNIQUE: Contiguous axial images were obtained from the base of the skull through the vertex without intravenous contrast. COMPARISON:  03/28/2016 FINDINGS: Brain: No intracranial hemorrhage, mass effect or midline shift. No acute cortical infarction. No mass lesion is noted on this unenhanced  scan. Mild cerebral atrophy. Vascular: Mild atherosclerotic calcifications of carotid siphon. Skull: No skull fracture is noted. Sinuses/Orbits: No acute finding. Other: None. IMPRESSION: No acute intracranial abnormality. Atherosclerotic calcifications of carotid siphon again noted. Mild cerebral atrophy again noted. Electronically Signed   By: Lahoma Crocker M.D.   On: 03/31/2016 11:49   Ct Chest W Contrast  Result Date: 04/01/2016 CLINICAL DATA:  Altered mental status and patient with elevated ammonia and bilirubin. History of gastric carcinoma. Question metastatic disease. EXAM: CT CHEST, ABDOMEN, AND PELVIS WITH CONTRAST TECHNIQUE: Multidetector CT imaging of the chest, abdomen and pelvis was performed following the standard protocol during bolus administration of intravenous contrast. CONTRAST:  100 ml ISOVUE-300 IOPAMIDOL (ISOVUE-300) INJECTION 61% COMPARISON:  CT abdomen and pelvis 12/02/2013. FINDINGS: CT CHEST FINDINGS Cardiovascular: Calcific aortic and coronary atherosclerosis is identified. Heart size is upper normal. No pericardial effusion. Mediastinum/Nodes: No axillary or supraclavicular lymphadenopathy. Subcarinal lymph node on image 24 measures 1.9 cm short axis dimension. Lesion which appears to be a mass along the distal esophagus near the gastroesophageal junction is multiple varices as seen on the prior CT abdomen and pelvis. No other lymphadenopathy is identified. Lungs/Pleura: No pleural effusion. No nodule or mass. There is scattered tears of mild dependent atelectasis is noted. Musculoskeletal: No lytic or sclerotic lesion is identified. Scattered Schmorl's nodes in the thoracic spine are noted. CT ABDOMEN PELVIS FINDINGS Hepatobiliary: The liver is markedly shrunken with a nodular border consistent with cirrhosis. Multiple esophageal and gastric varices are seen. There is a lesion in the dome of the liver which is hypoattenuating and measures 3.4 cm AP by 3.4 cm transverse on image 36 by  2.8 cm craniocaudal. A subtle focus of hyperattenuation in this location on the prior CT abdomen and pelvis measured 1.7 cm. Pancreas: Atrophic but otherwise unremarkable. Spleen: There is splenomegaly with the spleen measuring approximately 15 cm craniocaudal. Adrenals/Urinary Tract: Unremarkable. Stomach/Bowel: Gastrojejunostomy is again seen as on the prior examination. There is thickening of the walls of the ascending colon likely related to the patient's cirrhosis. The colon is otherwise unremarkable. The appendix has been removed. No small bowel obstruction. Vascular/Lymphatic: Aortoiliac atherosclerosis without aneurysm is identified. No lymphadenopathy is seen. Reproductive: There is mild prostatomegaly.  Otherwise unremarkable. Other: Small volume of abdominal and pelvic ascites is seen. No focal fluid collection. Musculoskeletal: Scattered Schmorl's nodes are seen. Mild anterior, inferior endplate compression fracture of T11 and biconcave compression fracture of T12 are unchanged. Postoperative change lower lumbar spine noted. Degenerative disease about the hips is worse on  the right. IMPRESSION: Markedly cirrhotic liver with portal hypertension including extensive gastric and esophageal variceal formation. Hypoattenuating lesion in the dome of the liver is identified and worrisome for hepatocellular carcinoma. Metastatic disease is also possible in this patient with a history of gastric carcinoma. When the patient is able to cooperate with breathing instructions, MRI of the abdomen with and without contrast could be used for further evaluation. Small volume of abdominal and pelvic ascites. Calcific aortic and coronary atherosclerosis. Electronically Signed   By: Inge Rise M.D.   On: 04/01/2016 16:10   Ct Abdomen Pelvis W Contrast  Result Date: 04/01/2016 CLINICAL DATA:  Altered mental status and patient with elevated ammonia and bilirubin. History of gastric carcinoma. Question metastatic  disease. EXAM: CT CHEST, ABDOMEN, AND PELVIS WITH CONTRAST TECHNIQUE: Multidetector CT imaging of the chest, abdomen and pelvis was performed following the standard protocol during bolus administration of intravenous contrast. CONTRAST:  100 ml ISOVUE-300 IOPAMIDOL (ISOVUE-300) INJECTION 61% COMPARISON:  CT abdomen and pelvis 12/02/2013. FINDINGS: CT CHEST FINDINGS Cardiovascular: Calcific aortic and coronary atherosclerosis is identified. Heart size is upper normal. No pericardial effusion. Mediastinum/Nodes: No axillary or supraclavicular lymphadenopathy. Subcarinal lymph node on image 24 measures 1.9 cm short axis dimension. Lesion which appears to be a mass along the distal esophagus near the gastroesophageal junction is multiple varices as seen on the prior CT abdomen and pelvis. No other lymphadenopathy is identified. Lungs/Pleura: No pleural effusion. No nodule or mass. There is scattered tears of mild dependent atelectasis is noted. Musculoskeletal: No lytic or sclerotic lesion is identified. Scattered Schmorl's nodes in the thoracic spine are noted. CT ABDOMEN PELVIS FINDINGS Hepatobiliary: The liver is markedly shrunken with a nodular border consistent with cirrhosis. Multiple esophageal and gastric varices are seen. There is a lesion in the dome of the liver which is hypoattenuating and measures 3.4 cm AP by 3.4 cm transverse on image 36 by 2.8 cm craniocaudal. A subtle focus of hyperattenuation in this location on the prior CT abdomen and pelvis measured 1.7 cm. Pancreas: Atrophic but otherwise unremarkable. Spleen: There is splenomegaly with the spleen measuring approximately 15 cm craniocaudal. Adrenals/Urinary Tract: Unremarkable. Stomach/Bowel: Gastrojejunostomy is again seen as on the prior examination. There is thickening of the walls of the ascending colon likely related to the patient's cirrhosis. The colon is otherwise unremarkable. The appendix has been removed. No small bowel obstruction.  Vascular/Lymphatic: Aortoiliac atherosclerosis without aneurysm is identified. No lymphadenopathy is seen. Reproductive: There is mild prostatomegaly.  Otherwise unremarkable. Other: Small volume of abdominal and pelvic ascites is seen. No focal fluid collection. Musculoskeletal: Scattered Schmorl's nodes are seen. Mild anterior, inferior endplate compression fracture of T11 and biconcave compression fracture of T12 are unchanged. Postoperative change lower lumbar spine noted. Degenerative disease about the hips is worse on the right. IMPRESSION: Markedly cirrhotic liver with portal hypertension including extensive gastric and esophageal variceal formation. Hypoattenuating lesion in the dome of the liver is identified and worrisome for hepatocellular carcinoma. Metastatic disease is also possible in this patient with a history of gastric carcinoma. When the patient is able to cooperate with breathing instructions, MRI of the abdomen with and without contrast could be used for further evaluation. Small volume of abdominal and pelvic ascites. Calcific aortic and coronary atherosclerosis. Electronically Signed   By: Inge Rise M.D.   On: 04/01/2016 16:10        Scheduled Meds: . clopidogrel  75 mg Oral Daily  . ferrous sulfate  325 mg Oral Daily  .  insulin aspart  0-15 Units Subcutaneous TID WC  . insulin aspart  0-5 Units Subcutaneous QHS  . levothyroxine  137 mcg Oral QAC breakfast  . metoprolol  50 mg Oral Daily  . pantoprazole  40 mg Oral Daily  . polyethylene glycol  17 g Oral Daily  . potassium chloride  10 mEq Oral Daily  . sodium chloride flush  3 mL Intravenous Q12H  . tamsulosin  0.4 mg Oral QPC supper   Continuous Infusions:    LOS: 0 days    Time spent:40 min    [place] Derks, Geraldo Docker, MD [facility] Pager [phone]  If 7PM-7AM, please contact night-coverage [email or web address] Password [email or web address] 04/01/2016, 7:25 PM

## 2016-04-01 NOTE — Progress Notes (Signed)
Pt has a very smelly, red rash bilaterally on in grion folds.

## 2016-04-01 NOTE — Evaluation (Signed)
Physical Therapy Evaluation Patient Details Name: Ian Cruz MRN: [id number] DOB: 1931-02-14 Today's Date: 04/01/2016   History of Present Illness  82 yo male admitted from ALF with AMS and lethargy was diagnosed wiht new cirrhosis, elevated but flat troponins, dementia with acute encephalopathy.  PMH:  OA, CAD, CVA, MI, DM  Clinical Impression  Pt is up to walk with limited tolerance for standing, was dizzy initially then confused about where to have his hands to transition, to use walker.  He is unsafe alone, was in ALF with some independence for mobility.  Will follow acutely for completion of mobility as far as possible to shorten SNF stay.    Follow Up Recommendations SNF    Equipment Recommendations  None recommended by PT    Recommendations for Other Services Rehab consult     Precautions / Restrictions Precautions Precautions: Fall (telemetry) Restrictions Weight Bearing Restrictions: No      Mobility  Bed Mobility Overal bed mobility: Needs Assistance Bed Mobility: Supine to Sit     Supine to sit: Mod assist     General bed mobility comments: instructed to reach for roll with LUE, sat up with trunk support and cues to flex legs to bring off the bed  Transfers Overall transfer level: Needs assistance Equipment used: Rolling walker (2 wheeled);1 person hand held assist             General transfer comment: cues for hand placement, tried walker first then PT assisted due to his struggle to stay wiht the walker, sits with no warning  Ambulation/Gait             General Gait Details: unable to do more than transfer  Stairs            Wheelchair Mobility    Modified Rankin (Stroke Patients Only) Modified Rankin (Stroke Patients Only) Pre-Morbid Rankin Score: Moderate disability Modified Rankin: Moderately severe disability     Balance                                             Pertinent Vitals/Pain Pain  Assessment: No/denies pain    Home Living Family/patient expects to be discharged to:: Assisted living               Home Equipment: Walker - 2 wheels;Wheelchair - manual      Prior Function Level of Independence: Needs assistance   Gait / Transfers Assistance Needed: wc most of the time or RW for short trips  ADL's / Homemaking Assistance Needed: ALF to care for cooking and cleaning        Hand Dominance        Extremity/Trunk Assessment   Upper Extremity Assessment: Generalized weakness           Lower Extremity Assessment: Generalized weakness      Cervical / Trunk Assessment: Normal  Communication   Communication: No difficulties  Cognition Arousal/Alertness: Awake/alert Behavior During Therapy: Restless Overall Cognitive Status: History of cognitive impairments - at baseline       Memory: Decreased recall of precautions;Decreased short-term memory              General Comments General comments (skin integrity, edema, etc.): Pt is up to walk enough to step to chair, confused about equipment use he reports having at ALF.      Exercises  Assessment/Plan    PT Assessment Patient needs continued PT services  PT Problem List Decreased strength;Decreased range of motion;Decreased activity tolerance;Decreased balance;Decreased mobility;Decreased coordination;Decreased cognition;Decreased knowledge of use of DME;Decreased safety awareness;Decreased knowledge of precautions;Cardiopulmonary status limiting activity;Decreased skin integrity          PT Treatment Interventions DME instruction;Gait training;Stair training;Functional mobility training;Therapeutic activities;Therapeutic exercise;Balance training;Neuromuscular re-education;Patient/family education    PT Goals (Current goals can be found in the Care Plan section)  Acute Rehab PT Goals Patient Stated Goal: To get home when able PT Goal Formulation: With patient Time For Goal  Achievement: 04/15/16 Potential to Achieve Goals: Good    Frequency Min 2X/week   Barriers to discharge Other (comment) (has declined from PLOF)      Co-evaluation               End of Session   Activity Tolerance: Patient tolerated treatment well;Patient limited by lethargy Patient left: in chair;with call bell/phone within reach;with chair alarm set Nurse Communication: Mobility status;Other (comment) (spoke with nurse about having pt up in chair, lactulose)    Functional Assessment Tool Used: clinical judgment Functional Limitation: Mobility: Walking and moving around Mobility: Walking and Moving Around Current Status [id number]): At least 40 percent but less than 60 percent impaired, limited or restricted Mobility: Walking and Moving Around Goal Status [phone]): At least 1 percent but less than 20 percent impaired, limited or restricted    Time: 0911-0944 PT Time Calculation (min) (ACUTE ONLY): 33 min   Charges:   PT Evaluation $PT Eval Low Complexity: 1 Procedure PT Treatments $Therapeutic Activity: 8-22 mins   PT G Codes:   PT G-Codes **NOT FOR INPATIENT CLASS** Functional Assessment Tool Used: clinical judgment Functional Limitation: Mobility: Walking and moving around Mobility: Walking and Moving Around Current Status [id number]): At least 40 percent but less than 60 percent impaired, limited or restricted Mobility: Walking and Moving Around Goal Status [phone]): At least 1 percent but less than 20 percent impaired, limited or restricted    Ramond Dial 04/01/2016, 10:23 AM    Mee Hives, PT MS Acute Rehab Dept. Number: [place] [facility] [place]

## 2016-04-01 NOTE — Progress Notes (Signed)
NURSING PROGRESS NOTE  Ian Cruz [id number] Admission Data: 04/01/2016 7:40 AM Attending Provider: Allie Bossier, MD [id number], MD Code Status: full  Ian Cruz is a 82 y.o. male patient admitted from ED:  -No acute distress noted.  -No complaints of shortness of breath.  -No complaints of chest pain.   Cardiac Monitoring: Box # 14 in place. Cardiac monitor yields:sinus bradycardia, sinus tachycardia.  Blood pressure (!) 118/40, pulse (!) 55, temperature 97.7 F (36.5 C), temperature source Axillary, resp. rate 18, height 5\' 10"  (1.778 m), weight 83.9 kg (185 lb), SpO2 100 %.   IV Fluids:  IV in place, occlusive dsg intact without redness, IV cath antecubital right, condition patent and no redness normal saline.   Allergies:  Review of patient's allergies indicates no known allergies.  Past Medical History:   has a past medical history of Acute encephalopathy; Anemia; Anemia; Cancer ([place]); Chronic low back pain; Coronary artery disease; Depression; Diabetes ([place]); Elevated bilirubin; Elevated troponin; GERD (gastroesophageal reflux disease); Hypertension; Hypothyroid; Inspiratory wheeze on examination; Left knee DJD; MI (myocardial infarction) ([place]) (1989); Neuropathy ([place]); Pneumonia; and Stroke ([place]) (2002).  Past Surgical History:   has a past surgical history that includes Appendectomy (1946); Back surgery (1974); Hemorrhoid surgery (1976); Stomach surgery (1992); Coronary artery bypass graft (1996); Cholecystectomy (2000); and Knee arthrotomy (1969).  Social History:   reports that he quit smoking about 43 years ago. His smoking use included Cigarettes. He has a 87.00 pack-year smoking history. He has never used smokeless tobacco. He reports that he does not drink alcohol or use drugs.  Skin: scattered bruises on his elbow on left arm  Patient/Family orientated to room. Information packet given to patient/family. Admission inpatient armband information verified  with patient/family to include name and date of birth and placed on patient arm. Side rails up x 2, fall assessment and education completed with patient/family. Patient/family able to verbalize understanding of risk associated with falls and verbalized understanding to call for assistance before getting out of bed. Call light within reach. Patient/family able to voice and demonstrate understanding of unit orientation instructions.    Will continue to evaluate and treat per MD orders.

## 2016-04-02 ENCOUNTER — Observation Stay (HOSPITAL_COMMUNITY): Payer: Medicare Other

## 2016-04-02 DIAGNOSIS — E038 Other specified hypothyroidism: Secondary | ICD-10-CM

## 2016-04-02 DIAGNOSIS — R935 Abnormal findings on diagnostic imaging of other abdominal regions, including retroperitoneum: Secondary | ICD-10-CM | POA: Diagnosis present

## 2016-04-02 DIAGNOSIS — K219 Gastro-esophageal reflux disease without esophagitis: Secondary | ICD-10-CM

## 2016-04-02 DIAGNOSIS — D649 Anemia, unspecified: Secondary | ICD-10-CM

## 2016-04-02 DIAGNOSIS — R160 Hepatomegaly, not elsewhere classified: Secondary | ICD-10-CM | POA: Diagnosis present

## 2016-04-02 DIAGNOSIS — G9340 Encephalopathy, unspecified: Secondary | ICD-10-CM | POA: Diagnosis not present

## 2016-04-02 DIAGNOSIS — E039 Hypothyroidism, unspecified: Secondary | ICD-10-CM

## 2016-04-02 LAB — CBC WITH DIFFERENTIAL/PLATELET
BASOS ABS: 0 10*3/uL (ref 0.0–0.1)
BASOS PCT: 0 %
Eosinophils Absolute: 0.1 10*3/uL (ref 0.0–0.7)
Eosinophils Relative: 2 %
HEMATOCRIT: 29.8 % — AB (ref 39.0–52.0)
HEMOGLOBIN: 9.5 g/dL — AB (ref 13.0–17.0)
Lymphocytes Relative: 15 %
Lymphs Abs: 0.4 10*3/uL — ABNORMAL LOW (ref 0.7–4.0)
MCH: 31.3 pg (ref 26.0–34.0)
MCHC: 31.9 g/dL (ref 30.0–36.0)
MCV: 98 fL (ref 78.0–100.0)
Monocytes Absolute: 0.3 10*3/uL (ref 0.1–1.0)
Monocytes Relative: 12 %
NEUTROS ABS: 2 10*3/uL (ref 1.7–7.7)
NEUTROS PCT: 71 %
Platelets: 62 10*3/uL — ABNORMAL LOW (ref 150–400)
RBC: 3.04 MIL/uL — AB (ref 4.22–5.81)
RDW: 16.9 % — ABNORMAL HIGH (ref 11.5–15.5)
WBC: 2.9 10*3/uL — ABNORMAL LOW (ref 4.0–10.5)

## 2016-04-02 LAB — BASIC METABOLIC PANEL
ANION GAP: 6 (ref 5–15)
BUN: 13 mg/dL (ref 6–20)
CALCIUM: 8.3 mg/dL — AB (ref 8.9–10.3)
CHLORIDE: 109 mmol/L (ref 101–111)
CO2: 23 mmol/L (ref 22–32)
Creatinine, Ser: 0.96 mg/dL (ref 0.61–1.24)
GFR calc non Af Amer: >60 mL/min (ref >60)
Glucose, Bld: 127 mg/dL — ABNORMAL HIGH (ref 65–99)
Potassium: 4.5 mmol/L (ref 3.5–5.1)
Sodium: 138 mmol/L (ref 135–145)

## 2016-04-02 LAB — LIPID PANEL
Cholesterol: 145 mg/dL (ref 0–200)
HDL: 36 mg/dL — AB (ref >40)
LDL Cholesterol: 96 mg/dL (ref 0–99)
TRIGLYCERIDES: 67 mg/dL (ref <150)
Total CHOL/HDL Ratio: 4 RATIO
VLDL: 13 mg/dL (ref 0–40)

## 2016-04-02 LAB — FOLATE RBC
FOLATE, HEMOLYSATE: 466.2 ng/mL
Folate, RBC: 1443 ng/mL (ref >498)
HEMATOCRIT: 32.3 % — AB (ref 37.5–51.0)

## 2016-04-02 LAB — GLUCOSE, CAPILLARY
GLUCOSE-CAPILLARY: 130 mg/dL — AB (ref 65–99)
GLUCOSE-CAPILLARY: 183 mg/dL — AB (ref 65–99)
GLUCOSE-CAPILLARY: 112 mg/dL — AB (ref 65–99)
GLUCOSE-CAPILLARY: 135 mg/dL — AB (ref 65–99)

## 2016-04-02 LAB — MAGNESIUM: Magnesium: 2.1 mg/dL (ref 1.7–2.4)

## 2016-04-02 LAB — AMMONIA: Ammonia: 74 umol/L — ABNORMAL HIGH (ref 9–35)

## 2016-04-02 LAB — RPR: RPR: NONREACTIVE

## 2016-04-02 LAB — HEMOGLOBIN A1C
Hgb A1c MFr Bld: 4.9 % (ref 4.8–5.6)
Mean Plasma Glucose: 94 mg/dL

## 2016-04-02 LAB — HEPATITIS PANEL, ACUTE
HCV Ab: <0.1 s/co ratio (ref 0.0–0.9)
HEP B C IGM: NEGATIVE
Hep A IgM: NEGATIVE
Hepatitis B Surface Ag: NEGATIVE

## 2016-04-02 LAB — HIV ANTIBODY (ROUTINE TESTING W REFLEX): HIV SCREEN 4TH GENERATION: NONREACTIVE

## 2016-04-02 MED ORDER — BUDESONIDE 0.25 MG/2ML IN SUSP
0.2500 mg | Freq: Two times a day (BID) | RESPIRATORY_TRACT | Status: DC
Start: 2016-04-02 — End: 2016-04-05
  Administered 2016-04-03 – 2016-04-05 (×5): 0.25 mg via RESPIRATORY_TRACT
  Filled 2016-04-02 (×5): qty 2

## 2016-04-02 MED ORDER — IPRATROPIUM-ALBUTEROL 0.5-2.5 (3) MG/3ML IN SOLN
3.0000 mL | Freq: Four times a day (QID) | RESPIRATORY_TRACT | Status: DC
  Filled 2016-04-02: qty 3

## 2016-04-02 MED ORDER — LACTULOSE 10 GM/15ML PO SOLN: 10.0000 g | Freq: Every day | ORAL | Status: DC | PRN

## 2016-04-02 MED ORDER — GADOBENATE DIMEGLUMINE 529 MG/ML IV SOLN
20.0000 mL | Freq: Once | INTRAVENOUS | Status: AC
  Administered 2016-04-02: 17 mL via INTRAVENOUS

## 2016-04-02 NOTE — Progress Notes (Signed)
PROGRESS NOTE    Ian Cruz  [id number] DOB: 08-10-1930 DOA: 03/31/2016 PCP: Carlus Pavlov, MD    Brief Narrative:  82 y.o.WM PMHx Dementia, Gastric cancer (possible metastases seen on CT abdomen 03/06/2011 ), left lung nodule (possible metastasis),HTN, MI, CAD native artery, CVA, Hypothyroid, GERD, CAD, DM Type 2, dementia   Presents to the emergency Department chief complaint acute encephalopathy. Initial evaluation reveals elevated troponin, mildly elevated total bilirubin mildly elevated ammonia level.  Information is obtained from the patient and the son who is at the bedside noting that information from patient may be unreliable due to dementia. He states that 3 days ago he fell while at church. He was taken to [facility] emergency department status post fall and also complaining of acute encephalopathy. It was presumed meclizine contributed to this and was discontinued. Reportedly he did well until 2 days ago when he gradually became more and more confused. This morning he would not speak and could not remember staff's name. He has no recollection of details but remembers falling while going to the bathroom at church. He does complain of a sore "tailbone". He denies any recent illness fever chills nausea vomiting diarrhea constipation. He does report some "dark" stool but denies bright red blood per rectum. He denies headache dizziness syncope or near-syncope. He denies chest pain palpitation does report some increased shortness of breath and wheezing over the last 2 days. As a former smoker but is not on oxygen at home. He reports his "daughter noticed the wheezing several days ago and got me some medicine".    Assessment & Plan:   Principal Problem:   Acute encephalopathy Active Problems:   Coronary artery disease   Hypertension   GERD (gastroesophageal reflux disease)   Hypothyroid   Chronic low back pain   Diabetes (HCC)   Elevated troponin   Elevated bilirubin  Inspiratory wheeze on examination   Altered mental status   CAD in native artery   Increased ammonia level   Uncontrolled type 2 diabetes mellitus with complication (HCC)   Hepatocellular carcinoma (HCC)   Malignant neoplasm of stomach (HCC)   Malignant neoplasm of esophagus (HCC)   Other specified hypothyroidism  #1 acute encephalopathy Likely secondary to her hepatic encephalopathy as CT abdomen and pelvis showing cirrhosis and probable mass in the dome of the liver concerning for metastatic disease versus hepatocellular carcinoma. Ammonia levels were elevated. Patient with clinical improvement. Patient likely needs to be started on lactulose. Follow.  #2 abnormal CT scan/mass in the dome of the liver/?? Mass and lower esophagus Per CT scan noted. Concern for hepatocellular carcinoma versus metastatic disease. AFP pending. Due to patient's prior history of gastric cancer and concern for mass in the distal esophagus will consult with gastroenterology for further evaluation and management.  #3 gastric varices/esophageal varices Continue current dose of beta blocker. Follow.  #4 elevated troponins/coronary artery disease Patient noted to have an initial troponin of 0.06. EKG with no ischemic changes. Chest x-ray unremarkable. Patient asymptomatic. No need for any further evaluation at this time.  #5 elevated ammonia levels Likely secondary to hepatic encephalopathy as patient noted to have cirrhosis. Patient will likely need to be started on lactulose.  #6 anemia Stable. No overt bleeding. Follow H&H.  #7 well-controlled type 2 diabetes mellitus Hemoglobin A1c is 4.9. CBGs have ranged from 112-183. Continue to hold oral hypoglycemic agents. Sliding scale insulin.  #8 hypertension Stable. Continue metoprolol.  #9 hypothyroidism TSH within normal limits of 0.583. Continue home dose  Synthroid.  #10 hypokalemia Repleted.     DVT prophylaxis: SCDs Code Status: Full Family  Communication: Updated patient. I did a daughter [name] via telephone [phone] Disposition Plan: Back to assisted living facility with home health   Consultants:   GI pending  Procedures:   CT chest 04/01/2016  CT abdomen and pelvis 04/01/2016  CT head 03/31/2016  Chest x-ray 03/31/2016  Plain films of the sacrum/coccyx 03/31/2016  Antimicrobials:   None   Subjective: Patient alert oriented to self place and time. Patient denies any chest pain. No shortness of breath. No difficulty swallowing.  Objective: Vitals:   04/01/16 1502 04/01/16 2029 04/01/16 2140 04/02/16 0509  BP: (!) 131/45  (!) 120/49 110/60  Pulse: (!) 54  (!) 57 (!) 59  Resp: 18  18 20   Temp: 98.1 F (36.7 C)  98.3 F (36.8 C) 98.2 F (36.8 C)  TempSrc: Oral  Oral Oral  SpO2: 100% 98% 98% 100%  Weight:      Height:        Intake/Output Summary (Last 24 hours) at 04/02/16 1322 Last data filed at 04/02/16 1000  Gross per 24 hour  Intake             1360 ml  Output              600 ml  Net              760 ml   Filed Weights   03/31/16 1902  Weight: 83.9 kg (185 lb)    Examination:  General exam: Appears calm and comfortable  Respiratory system: Clear to auscultation. Respiratory effort normal. Cardiovascular system: S1 & S2 heard, RRR. No JVD, murmurs, rubs, gallops or clicks. No pedal edema. Gastrointestinal system: Abdomen is nondistended, soft and nontender. No organomegaly or masses felt. Normal bowel sounds heard. Central nervous system: Alert and oriented. No focal neurological deficits. Extremities: Symmetric 5 x 5 power. Skin: No rashes, lesions or ulcers Psychiatry: Judgement and insight appear normal. Mood & affect appropriate.     Data Reviewed: I have personally reviewed following labs and imaging studies  CBC:  Recent Labs Lab 03/31/16 1133 04/01/16 0438 04/02/16 0632  WBC 2.2* 2.6* 2.9*  NEUTROABS 1.5*  --  2.0  HGB 9.4* 9.4* 9.5*  HCT 29.4* 28.8* 29.8*    MCV 97.4 96.3 98.0  PLT 70* 59* 62*   Basic Metabolic Panel:  Recent Labs Lab 03/31/16 1133 04/01/16 0438 04/02/16 0632  NA 141 143 138  K 3.5 3.2* 4.5  CL 114* 111 109  CO2 23 24 23   GLUCOSE 226* 93 127*  BUN 14 11 13   CREATININE 1.00 0.94 0.96  CALCIUM 8.2* 8.2* 8.3*  MG  --   --  2.1   GFR: Estimated Creatinine Clearance: 60.2 mL/min (by C-G formula based on SCr of 0.96 mg/dL). Liver Function Tests:  Recent Labs Lab 03/31/16 1133  AST 42*  ALT 18  ALKPHOS 106  BILITOT 1.7*  PROT 5.3*  ALBUMIN 2.6*   No results for input(s): LIPASE, AMYLASE in the last 168 hours.  Recent Labs Lab 03/31/16 1133 04/01/16 0438 04/02/16 0632  AMMONIA 63* 89* 74*   Coagulation Profile: No results for input(s): INR, PROTIME in the last 168 hours. Cardiac Enzymes:  Recent Labs Lab 03/31/16 1133 03/31/16 1911 04/01/16 0438  TROPONINI 0.06* 0.06* 0.06*   BNP (last 3 results) No results for input(s): PROBNP in the last 8760 hours. HbA1C:  Recent Labs  03/31/16 1911  HGBA1C 4.9   CBG:  Recent Labs Lab 04/01/16 1212 04/01/16 1719 04/01/16 2053 04/02/16 0808 04/02/16 1154  GLUCAP 172* 99 135* 130* 183*   Lipid Profile:  Recent Labs  04/02/16 0632  CHOL 145  HDL 36*  LDLCALC 96  TRIG 67  CHOLHDL 4.0   Thyroid Function Tests:  Recent Labs  03/31/16 1911  TSH 0.583   Anemia Panel:  Recent Labs  03/31/16 1911  VITAMINB12 337   Sepsis Labs: No results for input(s): PROCALCITON, LATICACIDVEN in the last 168 hours.  Recent Results (from the past 240 hour(s))  MRSA PCR Screening     Status: None   Collection Time: 03/31/16  8:45 PM  Result Value Ref Range Status   MRSA by PCR NEGATIVE NEGATIVE Final    Comment:        The GeneXpert MRSA Assay (FDA approved for NASAL specimens only), is one component of a comprehensive MRSA colonization surveillance program. It is not intended to diagnose MRSA infection nor to guide or monitor treatment  for MRSA infections.          Radiology Studies: Ct Chest W Contrast  Result Date: 04/01/2016 CLINICAL DATA:  Altered mental status and patient with elevated ammonia and bilirubin. History of gastric carcinoma. Question metastatic disease. EXAM: CT CHEST, ABDOMEN, AND PELVIS WITH CONTRAST TECHNIQUE: Multidetector CT imaging of the chest, abdomen and pelvis was performed following the standard protocol during bolus administration of intravenous contrast. CONTRAST:  100 ml ISOVUE-300 IOPAMIDOL (ISOVUE-300) INJECTION 61% COMPARISON:  CT abdomen and pelvis 12/02/2013. FINDINGS: CT CHEST FINDINGS Cardiovascular: Calcific aortic and coronary atherosclerosis is identified. Heart size is upper normal. No pericardial effusion. Mediastinum/Nodes: No axillary or supraclavicular lymphadenopathy. Subcarinal lymph node on image 24 measures 1.9 cm short axis dimension. Lesion which appears to be a mass along the distal esophagus near the gastroesophageal junction is multiple varices as seen on the prior CT abdomen and pelvis. No other lymphadenopathy is identified. Lungs/Pleura: No pleural effusion. No nodule or mass. There is scattered tears of mild dependent atelectasis is noted. Musculoskeletal: No lytic or sclerotic lesion is identified. Scattered Schmorl's nodes in the thoracic spine are noted. CT ABDOMEN PELVIS FINDINGS Hepatobiliary: The liver is markedly shrunken with a nodular border consistent with cirrhosis. Multiple esophageal and gastric varices are seen. There is a lesion in the dome of the liver which is hypoattenuating and measures 3.4 cm AP by 3.4 cm transverse on image 36 by 2.8 cm craniocaudal. A subtle focus of hyperattenuation in this location on the prior CT abdomen and pelvis measured 1.7 cm. Pancreas: Atrophic but otherwise unremarkable. Spleen: There is splenomegaly with the spleen measuring approximately 15 cm craniocaudal. Adrenals/Urinary Tract: Unremarkable. Stomach/Bowel: Gastrojejunostomy  is again seen as on the prior examination. There is thickening of the walls of the ascending colon likely related to the patient's cirrhosis. The colon is otherwise unremarkable. The appendix has been removed. No small bowel obstruction. Vascular/Lymphatic: Aortoiliac atherosclerosis without aneurysm is identified. No lymphadenopathy is seen. Reproductive: There is mild prostatomegaly.  Otherwise unremarkable. Other: Small volume of abdominal and pelvic ascites is seen. No focal fluid collection. Musculoskeletal: Scattered Schmorl's nodes are seen. Mild anterior, inferior endplate compression fracture of T11 and biconcave compression fracture of T12 are unchanged. Postoperative change lower lumbar spine noted. Degenerative disease about the hips is worse on the right. IMPRESSION: Markedly cirrhotic liver with portal hypertension including extensive gastric and esophageal variceal formation. Hypoattenuating lesion in the dome of the liver is identified and  worrisome for hepatocellular carcinoma. Metastatic disease is also possible in this patient with a history of gastric carcinoma. When the patient is able to cooperate with breathing instructions, MRI of the abdomen with and without contrast could be used for further evaluation. Small volume of abdominal and pelvic ascites. Calcific aortic and coronary atherosclerosis. Electronically Signed   By: Inge Rise M.D.   On: 04/01/2016 16:10   Ct Abdomen Pelvis W Contrast  Result Date: 04/01/2016 CLINICAL DATA:  Altered mental status and patient with elevated ammonia and bilirubin. History of gastric carcinoma. Question metastatic disease. EXAM: CT CHEST, ABDOMEN, AND PELVIS WITH CONTRAST TECHNIQUE: Multidetector CT imaging of the chest, abdomen and pelvis was performed following the standard protocol during bolus administration of intravenous contrast. CONTRAST:  100 ml ISOVUE-300 IOPAMIDOL (ISOVUE-300) INJECTION 61% COMPARISON:  CT abdomen and pelvis  12/02/2013. FINDINGS: CT CHEST FINDINGS Cardiovascular: Calcific aortic and coronary atherosclerosis is identified. Heart size is upper normal. No pericardial effusion. Mediastinum/Nodes: No axillary or supraclavicular lymphadenopathy. Subcarinal lymph node on image 24 measures 1.9 cm short axis dimension. Lesion which appears to be a mass along the distal esophagus near the gastroesophageal junction is multiple varices as seen on the prior CT abdomen and pelvis. No other lymphadenopathy is identified. Lungs/Pleura: No pleural effusion. No nodule or mass. There is scattered tears of mild dependent atelectasis is noted. Musculoskeletal: No lytic or sclerotic lesion is identified. Scattered Schmorl's nodes in the thoracic spine are noted. CT ABDOMEN PELVIS FINDINGS Hepatobiliary: The liver is markedly shrunken with a nodular border consistent with cirrhosis. Multiple esophageal and gastric varices are seen. There is a lesion in the dome of the liver which is hypoattenuating and measures 3.4 cm AP by 3.4 cm transverse on image 36 by 2.8 cm craniocaudal. A subtle focus of hyperattenuation in this location on the prior CT abdomen and pelvis measured 1.7 cm. Pancreas: Atrophic but otherwise unremarkable. Spleen: There is splenomegaly with the spleen measuring approximately 15 cm craniocaudal. Adrenals/Urinary Tract: Unremarkable. Stomach/Bowel: Gastrojejunostomy is again seen as on the prior examination. There is thickening of the walls of the ascending colon likely related to the patient's cirrhosis. The colon is otherwise unremarkable. The appendix has been removed. No small bowel obstruction. Vascular/Lymphatic: Aortoiliac atherosclerosis without aneurysm is identified. No lymphadenopathy is seen. Reproductive: There is mild prostatomegaly.  Otherwise unremarkable. Other: Small volume of abdominal and pelvic ascites is seen. No focal fluid collection. Musculoskeletal: Scattered Schmorl's nodes are seen. Mild anterior,  inferior endplate compression fracture of T11 and biconcave compression fracture of T12 are unchanged. Postoperative change lower lumbar spine noted. Degenerative disease about the hips is worse on the right. IMPRESSION: Markedly cirrhotic liver with portal hypertension including extensive gastric and esophageal variceal formation. Hypoattenuating lesion in the dome of the liver is identified and worrisome for hepatocellular carcinoma. Metastatic disease is also possible in this patient with a history of gastric carcinoma. When the patient is able to cooperate with breathing instructions, MRI of the abdomen with and without contrast could be used for further evaluation. Small volume of abdominal and pelvic ascites. Calcific aortic and coronary atherosclerosis. Electronically Signed   By: Inge Rise M.D.   On: 04/01/2016 16:10        Scheduled Meds: . clopidogrel  75 mg Oral Daily  . ferrous sulfate  325 mg Oral Daily  . insulin aspart  0-15 Units Subcutaneous TID WC  . insulin aspart  0-5 Units Subcutaneous QHS  . levothyroxine  137 mcg Oral QAC breakfast  .  metoprolol  50 mg Oral Daily  . nystatin   Topical TID  . pantoprazole  40 mg Oral Daily  . polyethylene glycol  17 g Oral Daily  . potassium chloride  10 mEq Oral Daily  . sodium chloride flush  3 mL Intravenous Q12H  . tamsulosin  0.4 mg Oral QPC supper   Continuous Infusions:    LOS: 0 days    Time spent: 40 minutes    Hillis Mcphatter, MD [facility] Pager [phone]  If 7PM-7AM, please contact night-coverage [email or web address] Password [email or web address] 04/02/2016, 1:22 PM

## 2016-04-02 NOTE — Consult Note (Signed)
Referring Provider: Dr. Marily Memos  Primary Care Physician:  Carlus Pavlov, MD Primary Gastroenterologist:  Dr. Melina Copa   Reason for Consultation:  Abnormal CT scan  HPI: Ian Cruz is a 82 y.o. male is admitted to the hospital for evaluation of acute encephalopathy anemia and elevated troponins. Patient has dementia but able to give limited history. History obtained from chart review as well as discussion with the nursing staff. Patient has a history of gastric cancer requiring resection in 1992, coronary artery disease, diabetes, history of stroke. Aberrantly patient had a fall 3 days ago was taken to [facility] emergency department. Meclizine was discontinued. Patient subsequently had confusion and was brought to ER from [place] [facility] for altered mental status. Patient underwent CT scan of the chest for further evaluation which showed cirrhosis of the liver with gastric and esophageal varices as well as hypo-attenuating lesion in the dome of the liver concerning for [place]. Subsequent CT abdomen showed 3.4 cm mass in the dome of the liver. There was mention of questionable mass at lower esophagus but it's collection of varices not to solid mass.  Ascending and examined. Currently denied abdominal pain. Patient is alert and oriented but not able to give detailed history. Denied nausea or vomiting. Had 1 bowel movement yesterday per nursing staff which was normal.  Patient is seen by Dr. Melina Copa in the past with last endoscopy  5 years ago per patient  Past Medical History:  Diagnosis Date  . Acute encephalopathy   . Anemia   . Anemia   . Cancer ([place])    stomach  . Chronic low back pain   . Coronary artery disease   . Depression   . Diabetes ([place])   . Elevated bilirubin   . Elevated troponin   . GERD (gastroesophageal reflux disease)   . Hypertension   . Hypothyroid   . Inspiratory wheeze on examination   . Left knee DJD   . MI (myocardial infarction) ([place]) 1989  . Neuropathy ([place])    . Pneumonia    hx  . Stroke [facility]) 2002    Past Surgical History:  Procedure Laterality Date  . APPENDECTOMY  1946  . BACK SURGERY  1974  . CHOLECYSTECTOMY  2000  . CORONARY ARTERY BYPASS GRAFT  1996   3 vessel  . [place]  . KNEE ARTHROTOMY  1969   left knee meniscus  . STOMACH SURGERY  1992   cancer    Prior to Admission medications   Medication Sig Start Date End Date Taking? Authorizing Provider  buPROPion (WELLBUTRIN XL) 150 MG 24 hr tablet Take 150 mg by mouth daily.   Yes Historical Provider, MD  ferrous sulfate 325 (65 FE) MG EC tablet Take 325 mg by mouth daily.   Yes Historical Provider, MD  finasteride (PROSCAR) 5 MG tablet Take 5 mg by mouth daily.   Yes Historical Provider, MD  glimepiride (AMARYL) 2 MG tablet Take 2 mg by mouth daily before breakfast.   Yes Historical Provider, MD  levothyroxine (SYNTHROID, LEVOTHROID) 125 MCG tablet Take 125 mcg by mouth daily. 08/15/14  Yes Historical Provider, MD  nadolol (CORGARD) 40 MG tablet Take 20 mg by mouth daily.   Yes Historical Provider, MD  pantoprazole (PROTONIX) 40 MG tablet Take 40 mg by mouth daily.   Yes Historical Provider, MD  polyethylene glycol (MIRALAX / GLYCOLAX) packet Take 17 g by mouth daily.   Yes Historical Provider, MD  potassium chloride (K-DUR,KLOR-CON) 10 MEQ tablet  08/31/14  Yes Historical Provider, MD  potassium chloride (MICRO-K) 10 MEQ CR capsule Take 10 mEq by mouth daily.   Yes Historical Provider, MD  PROAIR RESPICLICK [id number] (90 BASE) MCG/ACT AEPB  07/29/14  Yes Historical Provider, MD  tamsulosin (FLOMAX) 0.4 MG CAPS capsule  08/31/14  Yes Historical Provider, MD  celecoxib (CELEBREX) 200 MG capsule Take 200 mg by mouth 2 (two) times daily.    Historical Provider, MD  CHERATUSSIN AC 100-10 MG/5ML syrup  07/26/14   Historical Provider, MD  clopidogrel (PLAVIX) 75 MG tablet Take 75 mg by mouth daily.    Historical Provider, MD  furosemide (LASIX) 80 MG tablet  08/23/14   Historical  Provider, MD  HYDROcodone-acetaminophen (NORCO/VICODIN) 5-325 MG per tablet  10/05/12   Historical Provider, MD  levofloxacin (LEVAQUIN) 500 MG tablet Take 500 mg by mouth daily. 07/26/14   Historical Provider, MD  levothyroxine (SYNTHROID, LEVOTHROID) 137 MCG tablet Take 137 mcg by mouth daily before breakfast.    Historical Provider, MD  metFORMIN (GLUCOPHAGE) 500 MG tablet Take 500 mg by mouth 2 (two) times daily with a meal.    Historical Provider, MD  metoprolol (LOPRESSOR) 50 MG tablet Take 50 mg by mouth daily.    Historical Provider, MD  potassium chloride (K-DUR) 10 MEQ tablet Take 10 mEq by mouth daily.    Historical Provider, MD    Scheduled Meds: . clopidogrel  75 mg Oral Daily  . ferrous sulfate  325 mg Oral Daily  . insulin aspart  0-15 Units Subcutaneous TID WC  . insulin aspart  0-5 Units Subcutaneous QHS  . levothyroxine  137 mcg Oral QAC breakfast  . metoprolol  50 mg Oral Daily  . nystatin   Topical TID  . pantoprazole  40 mg Oral Daily  . polyethylene glycol  17 g Oral Daily  . potassium chloride  10 mEq Oral Daily  . sodium chloride flush  3 mL Intravenous Q12H  . tamsulosin  0.4 mg Oral QPC supper   Continuous Infusions:  PRN Meds:.acetaminophen **OR** acetaminophen, albuterol, HYDROcodone-acetaminophen, ondansetron **OR** ondansetron (ZOFRAN) IV, polyethylene glycol  Allergies as of 03/31/2016  . (No Known Allergies)    Family History  Problem Relation Age of Onset  . Heart disease Mother   . Hypertension Mother   . Stroke Mother   . Heart disease Father   . Heart attack Father   . Diabetes Father     Social History   Social History  . Marital status: Widowed    Spouse name: N/A  . Number of children: N/A  . Years of education: N/A   Occupational History  . Not on file.   Social History Main Topics  . Smoking status: Former Smoker    Packs/day: 3.00    Years: 29.00    Types: Cigarettes    Quit date: 11/10/1972  . Smokeless tobacco: Never  Used  . Alcohol use No  . Drug use: No  . Sexual activity: Not on file   Other Topics Concern  . Not on file   Social History Narrative  . No narrative on file    Review of Systems: Not able to obtain secondary to  dementia  Physical Exam: Vital signs: Vitals:   04/02/16 1407 04/02/16 1409  BP:  (!) 120/103  Pulse: (!) 58 (!) 57  Resp: 18   Temp: 98.2 F (36.8 C)    Last BM Date: 04/01/16 General:   Alert And oriented HEENT : NS, AT, EOMI  Lungs:  Clear throughout to auscultation.   No wheezes, crackles, or rhonchi. No acute distress. Heart:  Regular rate and rhythm; no murmurs, clicks, rubs,  or gallops. Abdomen: Soft, nontender, nondistended, bowel sounds present Rectal:  Deferred  GI:  Lab Results:  Recent Labs  03/31/16 1133 03/31/16 1911 04/01/16 0438 04/02/16 0632  WBC 2.2*  --  2.6* 2.9*  HGB 9.4*  --  9.4* 9.5*  HCT 29.4* 32.3* 28.8* 29.8*  PLT 70*  --  59* 62*   BMET  Recent Labs  03/31/16 1133 04/01/16 0438 04/02/16 0632  NA 141 143 138  K 3.5 3.2* 4.5  CL 114* 111 109  CO2 23 24 23   GLUCOSE 226* 93 127*  BUN 14 11 13   CREATININE 1.00 0.94 0.96  CALCIUM 8.2* 8.2* 8.3*   LFT  Recent Labs  03/31/16 1133  PROT 5.3*  ALBUMIN 2.6*  AST 42*  ALT 18  ALKPHOS 106  BILITOT 1.7*   PT/INR No results for input(s): LABPROT, INR in the last 72 hours.   Studies/Results: Ct Chest W Contrast  Result Date: 04/01/2016 CLINICAL DATA:  Altered mental status and patient with elevated ammonia and bilirubin. History of gastric carcinoma. Question metastatic disease. EXAM: CT CHEST, ABDOMEN, AND PELVIS WITH CONTRAST TECHNIQUE: Multidetector CT imaging of the chest, abdomen and pelvis was performed following the standard protocol during bolus administration of intravenous contrast. CONTRAST:  100 ml ISOVUE-300 IOPAMIDOL (ISOVUE-300) INJECTION 61% COMPARISON:  CT abdomen and pelvis 12/02/2013. FINDINGS: CT CHEST FINDINGS Cardiovascular: Calcific aortic  and coronary atherosclerosis is identified. Heart size is upper normal. No pericardial effusion. Mediastinum/Nodes: No axillary or supraclavicular lymphadenopathy. Subcarinal lymph node on image 24 measures 1.9 cm short axis dimension. Lesion which appears to be a mass along the distal esophagus near the gastroesophageal junction is multiple varices as seen on the prior CT abdomen and pelvis. No other lymphadenopathy is identified. Lungs/Pleura: No pleural effusion. No nodule or mass. There is scattered tears of mild dependent atelectasis is noted. Musculoskeletal: No lytic or sclerotic lesion is identified. Scattered Schmorl's nodes in the thoracic spine are noted. CT ABDOMEN PELVIS FINDINGS Hepatobiliary: The liver is markedly shrunken with a nodular border consistent with cirrhosis. Multiple esophageal and gastric varices are seen. There is a lesion in the dome of the liver which is hypoattenuating and measures 3.4 cm AP by 3.4 cm transverse on image 36 by 2.8 cm craniocaudal. A subtle focus of hyperattenuation in this location on the prior CT abdomen and pelvis measured 1.7 cm. Pancreas: Atrophic but otherwise unremarkable. Spleen: There is splenomegaly with the spleen measuring approximately 15 cm craniocaudal. Adrenals/Urinary Tract: Unremarkable. Stomach/Bowel: Gastrojejunostomy is again seen as on the prior examination. There is thickening of the walls of the ascending colon likely related to the patient's cirrhosis. The colon is otherwise unremarkable. The appendix has been removed. No small bowel obstruction. Vascular/Lymphatic: Aortoiliac atherosclerosis without aneurysm is identified. No lymphadenopathy is seen. Reproductive: There is mild prostatomegaly.  Otherwise unremarkable. Other: Small volume of abdominal and pelvic ascites is seen. No focal fluid collection. Musculoskeletal: Scattered Schmorl's nodes are seen. Mild anterior, inferior endplate compression fracture of T11 and biconcave compression  fracture of T12 are unchanged. Postoperative change lower lumbar spine noted. Degenerative disease about the hips is worse on the right. IMPRESSION: Markedly cirrhotic liver with portal hypertension including extensive gastric and esophageal variceal formation. Hypoattenuating lesion in the dome of the liver is identified and worrisome for hepatocellular carcinoma. Metastatic disease is also possible  in this patient with a history of gastric carcinoma. When the patient is able to cooperate with breathing instructions, MRI of the abdomen with and without contrast could be used for further evaluation. Small volume of abdominal and pelvic ascites. Calcific aortic and coronary atherosclerosis. Electronically Signed   By: Inge Rise M.D.   On: 04/01/2016 16:10   Ct Abdomen Pelvis W Contrast  Result Date: 04/01/2016 CLINICAL DATA:  Altered mental status and patient with elevated ammonia and bilirubin. History of gastric carcinoma. Question metastatic disease. EXAM: CT CHEST, ABDOMEN, AND PELVIS WITH CONTRAST TECHNIQUE: Multidetector CT imaging of the chest, abdomen and pelvis was performed following the standard protocol during bolus administration of intravenous contrast. CONTRAST:  100 ml ISOVUE-300 IOPAMIDOL (ISOVUE-300) INJECTION 61% COMPARISON:  CT abdomen and pelvis 12/02/2013. FINDINGS: CT CHEST FINDINGS Cardiovascular: Calcific aortic and coronary atherosclerosis is identified. Heart size is upper normal. No pericardial effusion. Mediastinum/Nodes: No axillary or supraclavicular lymphadenopathy. Subcarinal lymph node on image 24 measures 1.9 cm short axis dimension. Lesion which appears to be a mass along the distal esophagus near the gastroesophageal junction is multiple varices as seen on the prior CT abdomen and pelvis. No other lymphadenopathy is identified. Lungs/Pleura: No pleural effusion. No nodule or mass. There is scattered tears of mild dependent atelectasis is noted. Musculoskeletal: No lytic  or sclerotic lesion is identified. Scattered Schmorl's nodes in the thoracic spine are noted. CT ABDOMEN PELVIS FINDINGS Hepatobiliary: The liver is markedly shrunken with a nodular border consistent with cirrhosis. Multiple esophageal and gastric varices are seen. There is a lesion in the dome of the liver which is hypoattenuating and measures 3.4 cm AP by 3.4 cm transverse on image 36 by 2.8 cm craniocaudal. A subtle focus of hyperattenuation in this location on the prior CT abdomen and pelvis measured 1.7 cm. Pancreas: Atrophic but otherwise unremarkable. Spleen: There is splenomegaly with the spleen measuring approximately 15 cm craniocaudal. Adrenals/Urinary Tract: Unremarkable. Stomach/Bowel: Gastrojejunostomy is again seen as on the prior examination. There is thickening of the walls of the ascending colon likely related to the patient's cirrhosis. The colon is otherwise unremarkable. The appendix has been removed. No small bowel obstruction. Vascular/Lymphatic: Aortoiliac atherosclerosis without aneurysm is identified. No lymphadenopathy is seen. Reproductive: There is mild prostatomegaly.  Otherwise unremarkable. Other: Small volume of abdominal and pelvic ascites is seen. No focal fluid collection. Musculoskeletal: Scattered Schmorl's nodes are seen. Mild anterior, inferior endplate compression fracture of T11 and biconcave compression fracture of T12 are unchanged. Postoperative change lower lumbar spine noted. Degenerative disease about the hips is worse on the right. IMPRESSION: Markedly cirrhotic liver with portal hypertension including extensive gastric and esophageal variceal formation. Hypoattenuating lesion in the dome of the liver is identified and worrisome for hepatocellular carcinoma. Metastatic disease is also possible in this patient with a history of gastric carcinoma. When the patient is able to cooperate with breathing instructions, MRI of the abdomen with and without contrast could be  used for further evaluation. Small volume of abdominal and pelvic ascites. Calcific aortic and coronary atherosclerosis. Electronically Signed   By: Inge Rise M.D.   On: 04/01/2016 16:10    Impression/Plan: - Abnormal CT scan showing cirrhosis and 3.4 cm mass in the dome of the liver concerning for HCC versus metastatic disease - Cirrhosis per CT scan with normal thrombocytopenia - Hepatic encephalopathy. Improving - ? Mass at lower esophagus. CT abdomen and CT chest  showed collection of gastric and esophageal varices. There is no solid mass  per report - H/O Gastric cancer 1992 - status post resection.  Recommendations --------------------------- - Mass in the liver is concerning for [facility] given his cirrhosis.  - AFP pending - will get MRI abdomen with  IV contrast for further evaluation. If MRI is not diagnostic then consider liver biopsy - Start lactulose given hepatic encephalopathy. - No need for endoscopic intervention as lesion at the lower esophagus is collection of varices not a  solid mass - GI will follow    LOS: 0 days   Otis Brace  MD, FACP 04/02/2016, 3:34 PM  Pager [phone] If no answer or after 5 PM call [phone]

## 2016-04-02 NOTE — Care Management Obs Status (Signed)
[place] NOTIFICATION   Patient Details  Name: DALONTE QUICENO MRN: [id number] Date of Birth: 16-May-1931   Medicare Observation Status Notification Given:       Sharin Mons, RN 04/02/2016, 11:57 AM

## 2016-04-02 NOTE — Care Management Note (Signed)
Case Management Note  Patient Details  Name: Ian Cruz MRN: [id number] Date of Birth: Mar 24, 1931  Subjective/Objective:        Presents with encephalopathy, hx of  Dementia, Gastric cancer , left lung nodule (possible metastasis),HTN, MI, CAD native artery, CVA, Hypothyroid, GERD, CAD, DM Type 2, dementia. From ALF, [place]. 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  PCP: Carlus Pavlov   Action/Plan: GI consult in place...Marland KitchenMarland KitchenCM to f/u with disposition needs.  Expected Discharge Date:                  Expected Discharge Plan:  Assisted Living/Rest home [facility]/ALF)  In-House Referral:  Clinical Social Work  Discharge planning Services  CM Consult  Post Acute Care Choice:    Choice offered to:   pt/daughter  DME Arranged:    DME Agency:     HH Arranged:    [place] Agency:  [facility]/ referral to be made by CM if home health services are at d/c.   Status of Service:  Completed, signed off  If discussed at [place] of Stay Meetings, dates discussed:    Additional Comments:  Sharin Mons, RN 04/02/2016, 3:01 PM

## 2016-04-03 DIAGNOSIS — I2510 Atherosclerotic heart disease of native coronary artery without angina pectoris: Secondary | ICD-10-CM | POA: Diagnosis present

## 2016-04-03 DIAGNOSIS — R4182 Altered mental status, unspecified: Secondary | ICD-10-CM | POA: Diagnosis present

## 2016-04-03 DIAGNOSIS — Y9222 Religious institution as the place of occurrence of the external cause: Secondary | ICD-10-CM | POA: Diagnosis not present

## 2016-04-03 DIAGNOSIS — Z87891 Personal history of nicotine dependence: Secondary | ICD-10-CM | POA: Diagnosis not present

## 2016-04-03 DIAGNOSIS — Z23 Encounter for immunization: Secondary | ICD-10-CM | POA: Diagnosis not present

## 2016-04-03 DIAGNOSIS — R935 Abnormal findings on diagnostic imaging of other abdominal regions, including retroperitoneum: Secondary | ICD-10-CM | POA: Diagnosis not present

## 2016-04-03 DIAGNOSIS — F0390 Unspecified dementia without behavioral disturbance: Secondary | ICD-10-CM | POA: Diagnosis present

## 2016-04-03 DIAGNOSIS — C169 Malignant neoplasm of stomach, unspecified: Secondary | ICD-10-CM | POA: Diagnosis present

## 2016-04-03 DIAGNOSIS — E876 Hypokalemia: Secondary | ICD-10-CM | POA: Diagnosis present

## 2016-04-03 DIAGNOSIS — R778 Other specified abnormalities of plasma proteins: Secondary | ICD-10-CM | POA: Diagnosis present

## 2016-04-03 DIAGNOSIS — D649 Anemia, unspecified: Secondary | ICD-10-CM | POA: Diagnosis present

## 2016-04-03 DIAGNOSIS — I864 Gastric varices: Secondary | ICD-10-CM | POA: Diagnosis present

## 2016-04-03 DIAGNOSIS — C220 Liver cell carcinoma: Secondary | ICD-10-CM | POA: Diagnosis present

## 2016-04-03 DIAGNOSIS — E039 Hypothyroidism, unspecified: Secondary | ICD-10-CM | POA: Diagnosis present

## 2016-04-03 DIAGNOSIS — K7200 Acute and subacute hepatic failure without coma: Secondary | ICD-10-CM | POA: Diagnosis present

## 2016-04-03 DIAGNOSIS — G8929 Other chronic pain: Secondary | ICD-10-CM | POA: Diagnosis present

## 2016-04-03 DIAGNOSIS — K219 Gastro-esophageal reflux disease without esophagitis: Secondary | ICD-10-CM | POA: Diagnosis present

## 2016-04-03 DIAGNOSIS — E1165 Type 2 diabetes mellitus with hyperglycemia: Secondary | ICD-10-CM | POA: Diagnosis present

## 2016-04-03 DIAGNOSIS — R001 Bradycardia, unspecified: Secondary | ICD-10-CM | POA: Diagnosis present

## 2016-04-03 DIAGNOSIS — K7290 Hepatic failure, unspecified without coma: Secondary | ICD-10-CM

## 2016-04-03 DIAGNOSIS — K7460 Unspecified cirrhosis of liver: Secondary | ICD-10-CM | POA: Diagnosis present

## 2016-04-03 DIAGNOSIS — Z8673 Personal history of transient ischemic attack (TIA), and cerebral infarction without residual deficits: Secondary | ICD-10-CM | POA: Diagnosis not present

## 2016-04-03 DIAGNOSIS — Z993 Dependence on wheelchair: Secondary | ICD-10-CM | POA: Diagnosis not present

## 2016-04-03 DIAGNOSIS — W19XXXA Unspecified fall, initial encounter: Secondary | ICD-10-CM | POA: Diagnosis present

## 2016-04-03 DIAGNOSIS — R911 Solitary pulmonary nodule: Secondary | ICD-10-CM | POA: Diagnosis present

## 2016-04-03 DIAGNOSIS — M545 Low back pain: Secondary | ICD-10-CM | POA: Diagnosis present

## 2016-04-03 DIAGNOSIS — I8500 Esophageal varices without bleeding: Secondary | ICD-10-CM | POA: Diagnosis present

## 2016-04-03 DIAGNOSIS — I10 Essential (primary) hypertension: Secondary | ICD-10-CM | POA: Diagnosis present

## 2016-04-03 DIAGNOSIS — C159 Malignant neoplasm of esophagus, unspecified: Secondary | ICD-10-CM | POA: Diagnosis present

## 2016-04-03 LAB — CBC
HCT: 29.8 % — ABNORMAL LOW (ref 39.0–52.0)
HEMOGLOBIN: 9.5 g/dL — AB (ref 13.0–17.0)
MCH: 31.4 pg (ref 26.0–34.0)
MCHC: 31.9 g/dL (ref 30.0–36.0)
MCV: 98.3 fL (ref 78.0–100.0)
Platelets: 60 10*3/uL — ABNORMAL LOW (ref 150–400)
RBC: 3.03 MIL/uL — AB (ref 4.22–5.81)
RDW: 16.9 % — ABNORMAL HIGH (ref 11.5–15.5)
WBC: 2.5 10*3/uL — ABNORMAL LOW (ref 4.0–10.5)

## 2016-04-03 LAB — COMPREHENSIVE METABOLIC PANEL
ALK PHOS: 98 U/L (ref 38–126)
ALT: 17 U/L (ref 17–63)
ANION GAP: 9 (ref 5–15)
AST: 36 U/L (ref 15–41)
Albumin: 2.5 g/dL — ABNORMAL LOW (ref 3.5–5.0)
BUN: 15 mg/dL (ref 6–20)
CALCIUM: 8.4 mg/dL — AB (ref 8.9–10.3)
CO2: 20 mmol/L — AB (ref 22–32)
Chloride: 112 mmol/L — ABNORMAL HIGH (ref 101–111)
Creatinine, Ser: 0.96 mg/dL (ref 0.61–1.24)
GFR calc non Af Amer: >60 mL/min (ref >60)
Glucose, Bld: 146 mg/dL — ABNORMAL HIGH (ref 65–99)
POTASSIUM: 4.2 mmol/L (ref 3.5–5.1)
SODIUM: 141 mmol/L (ref 135–145)
Total Bilirubin: 1.5 mg/dL — ABNORMAL HIGH (ref 0.3–1.2)
Total Protein: 5.3 g/dL — ABNORMAL LOW (ref 6.5–8.1)

## 2016-04-03 LAB — GLUCOSE, CAPILLARY
GLUCOSE-CAPILLARY: 133 mg/dL — AB (ref 65–99)
GLUCOSE-CAPILLARY: 154 mg/dL — AB (ref 65–99)
GLUCOSE-CAPILLARY: 153 mg/dL — AB (ref 65–99)
GLUCOSE-CAPILLARY: 112 mg/dL — AB (ref 65–99)

## 2016-04-03 LAB — AMMONIA: AMMONIA: 101 umol/L — AB (ref 9–35)

## 2016-04-03 LAB — HEMOGLOBIN A1C
HEMOGLOBIN A1C: 4.9 % (ref 4.8–5.6)
MEAN PLASMA GLUCOSE: 94 mg/dL

## 2016-04-03 LAB — AFP TUMOR MARKER: AFP TUMOR MARKER: 364.6 ng/mL — AB (ref 0.0–8.3)

## 2016-04-03 MED ORDER — LACTULOSE 10 GM/15ML PO SOLN
10.0000 g | Freq: Three times a day (TID) | ORAL | Status: DC
  Administered 2016-04-03: 10 g via ORAL
  Filled 2016-04-03: qty 15

## 2016-04-03 MED ORDER — LACTULOSE 10 GM/15ML PO SOLN: 20.0000 g | Freq: Two times a day (BID) | ORAL | Status: DC

## 2016-04-03 MED ORDER — LACTULOSE 10 GM/15ML PO SOLN
20.0000 g | Freq: Three times a day (TID) | ORAL | Status: DC
  Administered 2016-04-03 – 2016-04-05 (×6): 20 g via ORAL
  Filled 2016-04-03 (×7): qty 30

## 2016-04-03 NOTE — Progress Notes (Signed)
PROGRESS NOTE    Ian Cruz  [id number] DOB: [birth year] DOA: 03/31/2016 PCP: Ian Pavlov, MD    Brief Narrative:  82 y.o.WM PMHx Dementia, Gastric cancer (possible metastases seen on CT abdomen 03/06/2011 ), left lung nodule (possible metastasis),HTN, MI, CAD native artery, CVA, Hypothyroid, GERD, CAD, DM Type 2, dementia   Presents to the emergency Department chief complaint acute encephalopathy. Initial evaluation reveals elevated troponin, mildly elevated total bilirubin mildly elevated ammonia level.  Information is obtained from the patient and the son who is at the bedside noting that information from patient may be unreliable due to dementia. He states that 3 days ago he fell while at church. He was taken to [facility] emergency department status post fall and also complaining of acute encephalopathy. It was presumed meclizine contributed to this and was discontinued. Reportedly he did well until 2 days ago when he gradually became more and more confused. This morning he would not speak and could not remember staff's name. He has no recollection of details but remembers falling while going to the bathroom at church. He does complain of a sore "tailbone". He denies any recent illness fever chills nausea vomiting diarrhea constipation. He does report some "dark" stool but denies bright red blood per rectum. He denies headache dizziness syncope or near-syncope. He denies chest pain palpitation does report some increased shortness of breath and wheezing over the last 2 days. As a former smoker but is not on oxygen at home. He reports his "daughter noticed the wheezing several days ago and got me some medicine".    Assessment & Plan:   Principal Problem:   Encephalopathy, hepatic (HCC) Active Problems:   Coronary artery disease   Hypertension   GERD (gastroesophageal reflux disease)   Hypothyroid   Chronic low back pain   Diabetes (HCC)   Elevated troponin   Elevated  bilirubin   Inspiratory wheeze on examination   Altered mental status   CAD in native artery   Increased ammonia level   Uncontrolled type 2 diabetes mellitus with complication (HCC)   Hepatocellular carcinoma (HCC)   Malignant neoplasm of stomach (HCC)   Malignant neoplasm of esophagus (HCC)   Other specified hypothyroidism   Liver mass   Abnormal CT of the abdomen  #1 acute hepatic encephalopathy Likely secondary to her hepatic encephalopathy as CT abdomen and pelvis showing cirrhosis and probable mass in the dome of the liver concerning for metastatic disease versus hepatocellular carcinoma. Ammonia levels were elevated. Patient confused today and thinks easing Ian Cruz in [place] patient also noted to have asterixis. Patient was started on lactulose yesterday and will increase dose to 20 g 3 times daily for goal bowel movements of 3-4 per day. Follow.  #2 abnormal CT scan/mass in the dome of the liver/?? Mass and lower esophagus Per CT scan noted. Concern for hepatocellular carcinoma versus metastatic disease. AFP elevated. Due to patient's prior history of gastric cancer and concern for mass in the distal esophagus, gastroenterology was consulted. MRI of the abdomen was recommended which was done 04/02/2016 which showed a 4.1 x 5.0 cm enhancing lesion and a 2.7 x 2.4 cm enhancing lesion worrisome for multifocal HCC in the setting of cirrhosis. Metastatic disease to be considered less likely. AFP which was obtained elevated at 364.6. GI felt lesion of the lower esophagus was a collection of varices and not a solid mass and a such no need for endoscopic intervention at this time. GI following and appreciate input and recommendations and  await further recommendations from MRI findings. Patient will likely need to follow-up with oncology in the outpatient setting.   #3 gastric varices/esophageal varices Continue current dose of beta blocker. Follow.  #4 elevated troponins/coronary  artery disease Patient noted to have an initial troponin of 0.06. EKG with no ischemic changes. Chest x-ray unremarkable. Patient asymptomatic. No need for any further evaluation at this time.  #5 elevated ammonia levels Likely secondary to hepatic encephalopathy as patient noted to have cirrhosis. Patient has been started on lactulose.  #6 anemia Stable. No overt bleeding. Follow H&H.  #7 well-controlled type 2 diabetes mellitus Hemoglobin A1c is 4.9. CBGs have ranged from 133-154. Continue to hold oral hypoglycemic agents. Sliding scale insulin.  #8 hypertension Stable. Continue metoprolol.  #9 hypothyroidism TSH within normal limits of 0.583. Continue home dose Synthroid.  #10 hypokalemia Repleted.     DVT prophylaxis: SCDs Code Status: Full Family Communication: Updated patient.  daughter Ian Cruz via telephone [phone] Disposition Plan: Back to assisted living facility with home health once patient's mentation has improved and is back to baseline and per further recommendations by GI.   Consultants:   GI pending  Procedures:   CT chest 04/01/2016  CT abdomen and pelvis 04/01/2016  CT head 03/31/2016  Chest x-ray 03/31/2016  Plain films of the sacrum/coccyx 03/31/2016  MRI liver 04/02/2016  Antimicrobials:   None   Subjective: Patient is a little slower this morning and thinks easing [name] in [name]. Patient pleasantly confused this morning. No chest pain. No shortness of breath.   Objective: Vitals:   04/02/16 2230 04/03/16 0553 04/03/16 0718 04/03/16 1523  BP: (!) 147/58 (!) 141/57  (!) 132/53  Pulse: 61 60  (!) 56  Resp: 18 18  20   Temp: 98.4 F (36.9 C) 98 F (36.7 C)  98.7 F (37.1 C)  TempSrc: Oral Oral  Oral  SpO2: 100% 100% 98% 98%  Weight:      Height:        Intake/Output Summary (Last 24 hours) at 04/03/16 1653 Last data filed at 04/03/16 1535  Gross per 24 hour  Intake              480 ml  Output               800 ml  Net             -320 ml   Filed Weights   03/31/16 1902  Weight: 83.9 kg (185 lb)    Examination:  General exam: Appears calm and comfortable.Confused. Asterixis. Respiratory system: Clear to auscultation. Respiratory effort normal. Cardiovascular system: S1 & S2 heard, RRR. No JVD, murmurs, rubs, gallops or clicks. No pedal edema. Gastrointestinal system: Abdomen is nondistended, soft and nontender. No organomegaly or masses felt. Normal bowel sounds heard. Central nervous system: Alert and oriented. No focal neurological deficits. Extremities: Symmetric 5 x 5 power. Skin: No rashes, lesions or ulcers Psychiatry: Judgement and insight appear normal. Mood & affect appropriate.     Data Reviewed: I have personally reviewed following labs and imaging studies  CBC:  Recent Labs Lab 03/31/16 1133 03/31/16 1911 04/01/16 0438 04/02/16 0632 04/03/16 0500  WBC 2.2*  --  2.6* 2.9* 2.5*  NEUTROABS 1.5*  --   --  2.0  --   HGB 9.4*  --  9.4* 9.5* 9.5*  HCT 29.4* 32.3* 28.8* 29.8* 29.8*  MCV 97.4  --  96.3 98.0 98.3  PLT 70*  --  59* 62* 60*  Basic Metabolic Panel:  Recent Labs Lab 03/31/16 1133 04/01/16 0438 04/02/16 0632 04/03/16 0500  NA 141 143 138 141  K 3.5 3.2* 4.5 4.2  CL 114* 111 109 112*  CO2 23 24 23  20*  GLUCOSE 226* 93 127* 146*  BUN 14 11 13 15   CREATININE 1.00 0.94 0.96 0.96  CALCIUM 8.2* 8.2* 8.3* 8.4*  MG  --   --  2.1  --    GFR: Estimated Creatinine Clearance: 60.2 mL/min (by C-G formula based on SCr of 0.96 mg/dL). Liver Function Tests:  Recent Labs Lab 03/31/16 1133 04/03/16 0500  AST 42* 36  ALT 18 17  ALKPHOS 106 98  BILITOT 1.7* 1.5*  PROT 5.3* 5.3*  ALBUMIN 2.6* 2.5*   No results for input(s): LIPASE, AMYLASE in the last 168 hours.  Recent Labs Lab 03/31/16 1133 04/01/16 0438 04/02/16 0632 04/03/16 0500  AMMONIA 63* 89* 74* 101*   Coagulation Profile: No results for input(s): INR, PROTIME in the last 168  hours. Cardiac Enzymes:  Recent Labs Lab 03/31/16 1133 03/31/16 1911 04/01/16 0438  TROPONINI 0.06* 0.06* 0.06*   BNP (last 3 results) No results for input(s): PROBNP in the last 8760 hours. HbA1C:  Recent Labs  03/31/16 1911 04/02/16 0632  HGBA1C 4.9 4.9   CBG:  Recent Labs Lab 04/02/16 1154 04/02/16 1729 04/02/16 2234 04/03/16 0752 04/03/16 1208  GLUCAP 183* 112* 135* 133* 154*   Lipid Profile:  Recent Labs  04/02/16 0632  CHOL 145  HDL 36*  LDLCALC 96  TRIG 67  CHOLHDL 4.0   Thyroid Function Tests:  Recent Labs  03/31/16 1911  TSH 0.583   Anemia Panel:  Recent Labs  03/31/16 1911  VITAMINB12 337   Sepsis Labs: No results for input(s): PROCALCITON, LATICACIDVEN in the last 168 hours.  Recent Results (from the past 240 hour(s))  MRSA PCR Screening     Status: None   Collection Time: 03/31/16  8:45 PM  Result Value Ref Range Status   MRSA by PCR NEGATIVE NEGATIVE Final    Comment:        The GeneXpert MRSA Assay (FDA approved for NASAL specimens only), is one component of a comprehensive MRSA colonization surveillance program. It is not intended to diagnose MRSA infection nor to guide or monitor treatment for MRSA infections.          Radiology Studies: Mr Liver W Wo Contrast  Result Date: 04/02/2016 CLINICAL DATA:  Liver mass, cirrhosis EXAM: MRI ABDOMEN WITHOUT AND WITH CONTRAST TECHNIQUE: Multiplanar multisequence MR imaging of the abdomen was performed both before and after the administration of intravenous contrast. CONTRAST:  71mL MULTIHANCE GADOBENATE DIMEGLUMINE 529 MG/ML IV SOLN COMPARISON:  CT abdomen/pelvis dated 04/01/2016 FINDINGS: Motion degraded images. Postcontrast imaging is particularly limited. Lower chest: Lung bases are clear. Hepatobiliary: Cirrhosis. 4.1 x 5.0 cm heterogeneously enhancing lesion at the junction of segment 8 and 4A (series 6/ image 6). Additional 2.7 x 2.4 cm enhancing lesion in segment 8  (series 6/ image 11). Enhancement characteristics are difficult to characterize given the motion degradation. Regardless, these are worrisome for multifocal HCC in the setting of cirrhosis. Metastatic disease would be considered less likely. Status post cholecystectomy. No intrahepatic or extrahepatic ductal dilatation. Pancreas:  Poorly visualized. Spleen: Mildly enlarged, measuring 13.9 cm in craniocaudal dimension. Adrenals/Urinary Tract:  Adrenal glands are grossly unremarkable. Kidneys are within normal limits.  No hydronephrosis. Stomach/Bowel: Stomach is within normal limits. Mild wall thickening along the right colon, likely related  to venous congestion. Vascular/Lymphatic:  No evidence of abdominal aortic aneurysm. Gastroesophageal varices (series 6/images 10 and 20). Portal vein is patent. No suspicious abdominal lymphadenopathy. Other:  Small volume abdominal ascites. Musculoskeletal: No focal osseous lesions. IMPRESSION: Motion degraded images. Postcontrast imaging is particularly limited. 4.1 x 5.0 cm enhancing lesion at the junction of segment 8 and 4A. Additional 2.7 x 2.4 cm enhancing lesion in segment 8. While poorly evaluated, these findings are worrisome for multifocal HCC in the setting of cirrhosis. Metastatic disease would be considered less likely. Correlate with AFP. Cirrhosis. Stigmata of portal hypertension, including gastroesophageal varices, splenomegaly, and small volume abdominal ascites. Additional ancillary findings as above. Electronically Signed   By: Julian Hy M.D.   On: 04/02/2016 23:48        Scheduled Meds: . budesonide (PULMICORT) nebulizer solution  0.25 mg Nebulization BID  . clopidogrel  75 mg Oral Daily  . ferrous sulfate  325 mg Oral Daily  . insulin aspart  0-15 Units Subcutaneous TID WC  . insulin aspart  0-5 Units Subcutaneous QHS  . lactulose  20 g Oral TID  . levothyroxine  137 mcg Oral QAC breakfast  . metoprolol  50 mg Oral Daily  . nystatin    Topical TID  . pantoprazole  40 mg Oral Daily  . potassium chloride  10 mEq Oral Daily  . sodium chloride flush  3 mL Intravenous Q12H  . tamsulosin  0.4 mg Oral QPC supper   Continuous Infusions:    LOS: 0 days    Time spent: 40 minutes    THOMPSON,DANIEL, MD [facility] Pager [phone]  If 7PM-7AM, please contact night-coverage [email or web address] Password [facility] 04/03/2016, 4:53 PM

## 2016-04-03 NOTE — Progress Notes (Signed)
[facility] Gastroenterology Progress Note  DAERON AARDEMA 82 y.o. [birth year]   Subjective: Sleeping and difficult to arouse. Denies abdominal pain. Not oriented to time. Knows he is in [place] but unable to say he's in a hospital. No family at bedside.  Objective: Vital signs in last 24 hours: Vitals:   04/03/16 0553 04/03/16 1523  BP: (!) 141/57 (!) 132/53  Pulse: 60 (!) 56  Resp: 18 20  Temp: 98 F (36.7 C) 98.7 F (37.1 C)    Physical Exam: Gen: somnolent, elderly, frail, not oriented to place or time CV: RRR Chest: Coarse breath sounds Abd: RUQ and right-sided tenderness with guarding, mild distention, soft, +BS Ext: no edema  Lab Results:  Recent Labs  04/02/16 0632 04/03/16 0500  NA 138 141  K 4.5 4.2  CL 109 112*  CO2 23 20*  GLUCOSE 127* 146*  BUN 13 15  CREATININE 0.96 0.96  CALCIUM 8.3* 8.4*  MG 2.1  --     Recent Labs  04/03/16 0500  AST 36  ALT 17  ALKPHOS 98  BILITOT 1.5*  PROT 5.3*  ALBUMIN 2.5*    Recent Labs  04/02/16 0632 04/03/16 0500  WBC 2.9* 2.5*  NEUTROABS 2.0  --   HGB 9.5* 9.5*  HCT 29.8* 29.8*  MCV 98.0 98.3  PLT 62* 60*   No results for input(s): LABPROT, INR in the last 72 hours.    Assessment/Plan: Liver masses concerning for hepatocellular carcinoma on MRI in the setting of cirrhosis. AFP elevated at 364. Liver biopsy not needed based on MRI findings. I doubt he would be a candidate for any treatment for his [place] due to his functional status and prior history of gastric cancer. No plans to do an EGD (see Dr. Leanna Sato note from 04/02/16). Consult oncology if treatment desired by family to discuss options but I would favor palliative care. Will sign off. Call if questions.   Woolsey C. 04/03/2016, 4:56 PM  Pager [phone]  If no answer or after 5 PM call [phone] ID: Ricarda Frame, male   DOB: 09-12-1930, 82 y.o.   MRN: [id number]

## 2016-04-04 DIAGNOSIS — E118 Type 2 diabetes mellitus with unspecified complications: Secondary | ICD-10-CM

## 2016-04-04 DIAGNOSIS — C220 Liver cell carcinoma: Secondary | ICD-10-CM

## 2016-04-04 DIAGNOSIS — E1165 Type 2 diabetes mellitus with hyperglycemia: Secondary | ICD-10-CM

## 2016-04-04 DIAGNOSIS — R7989 Other specified abnormal findings of blood chemistry: Secondary | ICD-10-CM

## 2016-04-04 LAB — COMPREHENSIVE METABOLIC PANEL
ALBUMIN: 2.6 g/dL — AB (ref 3.5–5.0)
ALT: 18 U/L (ref 17–63)
ANION GAP: 7 (ref 5–15)
AST: 32 U/L (ref 15–41)
Alkaline Phosphatase: 99 U/L (ref 38–126)
BILIRUBIN TOTAL: 1.7 mg/dL — AB (ref 0.3–1.2)
BUN: 16 mg/dL (ref 6–20)
CO2: 18 mmol/L — ABNORMAL LOW (ref 22–32)
Calcium: 8.4 mg/dL — ABNORMAL LOW (ref 8.9–10.3)
Chloride: 114 mmol/L — ABNORMAL HIGH (ref 101–111)
Creatinine, Ser: 0.8 mg/dL (ref 0.61–1.24)
GFR calc Af Amer: >60 mL/min (ref >60)
Glucose, Bld: 120 mg/dL — ABNORMAL HIGH (ref 65–99)
POTASSIUM: 3.8 mmol/L (ref 3.5–5.1)
Sodium: 139 mmol/L (ref 135–145)
TOTAL PROTEIN: 5.5 g/dL — AB (ref 6.5–8.1)

## 2016-04-04 LAB — CBC
HEMATOCRIT: 29.9 % — AB (ref 39.0–52.0)
HEMOGLOBIN: 9.5 g/dL — AB (ref 13.0–17.0)
MCH: 31.4 pg (ref 26.0–34.0)
MCHC: 31.8 g/dL (ref 30.0–36.0)
MCV: 98.7 fL (ref 78.0–100.0)
Platelets: 78 10*3/uL — ABNORMAL LOW (ref 150–400)
RBC: 3.03 MIL/uL — AB (ref 4.22–5.81)
RDW: 17.1 % — ABNORMAL HIGH (ref 11.5–15.5)
WBC: 3.5 10*3/uL — AB (ref 4.0–10.5)

## 2016-04-04 LAB — GLUCOSE, CAPILLARY
GLUCOSE-CAPILLARY: 125 mg/dL — AB (ref 65–99)
GLUCOSE-CAPILLARY: 126 mg/dL — AB (ref 65–99)
GLUCOSE-CAPILLARY: 141 mg/dL — AB (ref 65–99)
GLUCOSE-CAPILLARY: 148 mg/dL — AB (ref 65–99)
Glucose-Capillary: 84 mg/dL (ref 65–99)

## 2016-04-04 LAB — AMMONIA: Ammonia: 57 umol/L — ABNORMAL HIGH (ref 9–35)

## 2016-04-04 LAB — PROTIME-INR
INR: 1.29
Prothrombin Time: 16.1 seconds — ABNORMAL HIGH (ref 11.4–15.2)

## 2016-04-04 MED ORDER — SODIUM BICARBONATE 650 MG PO TABS
650.0000 mg | ORAL_TABLET | Freq: Two times a day (BID) | ORAL | Status: DC
  Administered 2016-04-04 – 2016-04-05 (×3): 650 mg via ORAL
  Filled 2016-04-04 (×3): qty 1

## 2016-04-04 NOTE — Progress Notes (Signed)
PROGRESS NOTE    Ian Cruz  [id number] DOB: [birth year] DOA: 03/31/2016 PCP: Carlus Pavlov, MD    Brief Narrative:  82 y.o.WM PMHx Dementia, Gastric cancer (possible metastases seen on CT abdomen 03/06/2011 ), left lung nodule (possible metastasis),HTN, MI, CAD native artery, CVA, Hypothyroid, GERD, CAD, DM Type 2, dementia   Presents to the emergency Department chief complaint acute encephalopathy. Initial evaluation reveals elevated troponin, mildly elevated total bilirubin mildly elevated ammonia level.  Information is obtained from the patient and the son who is at the bedside noting that information from patient may be unreliable due to dementia. He states that 3 days ago he fell while at church. He was taken to [facility] emergency department status post fall and also complaining of acute encephalopathy. It was presumed meclizine contributed to this and was discontinued. Reportedly he did well until 2 days ago when he gradually became more and more confused. This morning he would not speak and could not remember staff's name. He has no recollection of details but remembers falling while going to the bathroom at church. He does complain of a sore "tailbone". He denies any recent illness fever chills nausea vomiting diarrhea constipation. He does report some "dark" stool but denies bright red blood per rectum. He denies headache dizziness syncope or near-syncope. He denies chest pain palpitation does report some increased shortness of breath and wheezing over the last 2 days. As a former smoker but is not on oxygen at home. He reports his "daughter noticed the wheezing several days ago and got me some medicine".    Assessment & Plan:   Principal Problem:   Encephalopathy, hepatic (HCC) Active Problems:   Coronary artery disease   Hypertension   GERD (gastroesophageal reflux disease)   Hypothyroid   Chronic low back pain   Diabetes (HCC)   Elevated troponin   Elevated  bilirubin   Inspiratory wheeze on examination   Altered mental status   CAD in native artery   Increased ammonia level   Uncontrolled type 2 diabetes mellitus with complication (HCC)   Hepatocellular carcinoma (HCC)   Malignant neoplasm of stomach (HCC)   Malignant neoplasm of esophagus (HCC)   Other specified hypothyroidism   Liver mass   Abnormal CT of the abdomen  #1 acute hepatic encephalopathy Likely secondary to her hepatic encephalopathy as CT abdomen and pelvis showing cirrhosis and probable mass in the dome of the liver concerning for multifocal hepatocellular carcinoma. Ammonia levels were elevated. Patient with improvement with confusion after being started on lactulose 20 g 3 times daily. Continue current dose of lactulose for goal bowel movements of 3-4 per day. Follow.  #2 abnormal CT scan/mass in the dome of the liver/Probable HCC lesions Per CT scan noted. Concern for hepatocellular carcinoma versus metastatic disease. AFP elevated. Due to patient's prior history of gastric cancer and concern for mass in the distal esophagus, gastroenterology was consulted. MRI of the abdomen was recommended which was done 04/02/2016 which showed a 4.1 x 5.0 cm enhancing lesion and a 2.7 x 2.4 cm enhancing lesion worrisome for multifocal HCC in the setting of cirrhosis. Metastatic disease to be considered less likely. AFP which was obtained elevated at 364.6. GI felt lesion of the lower esophagus was a collection of varices and not a solid mass and as such no need for endoscopic intervention at this time. GI following and appreciate input and recommendations and await further recommendations from MRI findings. Patient will likely need to follow-up with oncology in the  outpatient setting.   #3 gastric varices/esophageal varices Patient was on nadolol. Continue current dose of beta blocker. Follow.  #4 elevated troponins/coronary artery disease Patient noted to have an initial troponin of 0.06.  EKG with no ischemic changes. Chest x-ray unremarkable. Patient asymptomatic. No need for any further evaluation at this time.  #5 elevated ammonia levels Likely secondary to hepatic encephalopathy as patient noted to have cirrhosis. Patient has been started on lactulose. Ammonia levels trending down.  #6 anemia Stable. No overt bleeding. Follow H&H.  #7 well-controlled type 2 diabetes mellitus Hemoglobin A1c is 4.9. CBGs have ranged from 125-148. Continue to hold oral hypoglycemic agents. Sliding scale insulin.  #8 hypertension Stable. Continue metoprolol.  #9 hypothyroidism TSH within normal limits of 0.583. Continue home dose Synthroid.  #10 hypokalemia Repleted.     DVT prophylaxis: SCDs Code Status: Full Family Communication: Updated patient and son at bedside..  daughter [name] via telephone [phone] Disposition Plan: Back to assisted living facility with home health once patient's mentation has improved and is back to baseline and per further recommendations by GI.   Consultants:   GI: Dr Alessandra Bevels 04/02/2016  Procedures:   CT chest 04/01/2016  CT abdomen and pelvis 04/01/2016  CT head 03/31/2016  Chest x-ray 03/31/2016  Plain films of the sacrum/coccyx 03/31/2016  MRI liver 04/02/2016  Antimicrobials:   None   Subjective: Patient is sitting up in chair eating lunch with family at bedside. Patient is more alert and oriented to self and place and time. Patient states had a large bowel movement last night.  Objective: Vitals:   04/03/16 2146 04/03/16 2147 04/04/16 0623 04/04/16 1515  BP: (!) 141/61 (!) 141/61 122/85 (!) 116/52  Pulse: 65 65 67 (!) 123  Resp: 19 19 (!) 22 18  Temp: 98.8 F (37.1 C) 98.8 F (37.1 C) 98.8 F (37.1 C)   TempSrc:  Oral Oral   SpO2: 99% 99% 99% (!) 89%  Weight:      Height:       No intake or output data in the 24 hours ending 04/04/16 1711 Filed Weights   03/31/16 1902  Weight: 83.9 kg (185 lb)     Examination:  General exam: Appears calm and comfortable.Alert, less confused. Asterix[place]. Respiratory system: Clear to auscultation. Respiratory effort normal. Upper airway noise. Cardiovascular system: S1 & S2 heard, RRR. No JVD, murmurs, rubs, gallops or clicks. No pedal edema. Gastrointestinal system: Abdomen is nondistended, soft and nontender. No organomegaly or masses felt. Normal bowel sounds heard. Central nervous system: Alert and oriented. No focal neurological deficits. Extremities: Symmetric 5 x 5 power. Skin: No rashes, lesions or ulcers Psychiatry: Judgement and insight appear normal. Mood & affect appropriate.     Data Reviewed: I have personally reviewed following labs and imaging studies  CBC:  Recent Labs Lab 03/31/16 1133 03/31/16 1911 04/01/16 0438 04/02/16 0632 04/03/16 0500 04/04/16 0522  WBC 2.2*  --  2.6* 2.9* 2.5* 3.5*  NEUTROABS 1.5*  --   --  2.0  --   --   HGB 9.4*  --  9.4* 9.5* 9.5* 9.5*  HCT 29.4* 32.3* 28.8* 29.8* 29.8* 29.9*  MCV 97.4  --  96.3 98.0 98.3 98.7  PLT 70*  --  59* 62* 60* 78*   Basic Metabolic Panel:  Recent Labs Lab 03/31/16 1133 04/01/16 0438 04/02/16 0632 04/03/16 0500 04/04/16 0522  NA 141 143 138 141 139  K 3.5 3.2* 4.5 4.2 3.8  CL 114* 111 109 112* 114*  CO2 23 24 23  20* 18*  GLUCOSE 226* 93 127* 146* 120*  BUN 14 11 13 15 16   CREATININE 1.00 0.94 0.96 0.96 0.80  CALCIUM 8.2* 8.2* 8.3* 8.4* 8.4*  MG  --   --  2.1  --   --    GFR: Estimated Creatinine Clearance: 72.2 mL/min (by C-G formula based on SCr of 0.8 mg/dL). Liver Function Tests:  Recent Labs Lab 03/31/16 1133 04/03/16 0500 04/04/16 0522  AST 42* 36 32  ALT 18 17 18   ALKPHOS 106 98 99  BILITOT 1.7* 1.5* 1.7*  PROT 5.3* 5.3* 5.5*  ALBUMIN 2.6* 2.5* 2.6*   No results for input(s): LIPASE, AMYLASE in the last 168 hours.  Recent Labs Lab 03/31/16 1133 04/01/16 0438 04/02/16 0632 04/03/16 0500 04/04/16 0522  AMMONIA 63* 89* 74*  101* 57*   Coagulation Profile:  Recent Labs Lab 04/04/16 0522  INR 1.29   Cardiac Enzymes:  Recent Labs Lab 03/31/16 1133 03/31/16 1911 04/01/16 0438  TROPONINI 0.06* 0.06* 0.06*   BNP (last 3 results) No results for input(s): PROBNP in the last 8760 hours. HbA1C:  Recent Labs  04/02/16 0632  HGBA1C 4.9   CBG:  Recent Labs Lab 04/03/16 1654 04/03/16 2144 04/04/16 0751 04/04/16 1201 04/04/16 1652  GLUCAP 153* 112* 141* 148* 125*   Lipid Profile:  Recent Labs  04/02/16 0632  CHOL 145  HDL 36*  LDLCALC 96  TRIG 67  CHOLHDL 4.0   Thyroid Function Tests: No results for input(s): TSH, T4TOTAL, FREET4, T3FREE, THYROIDAB in the last 72 hours. Anemia Panel: No results for input(s): VITAMINB12, FOLATE, FERRITIN, TIBC, IRON, RETICCTPCT in the last 72 hours. Sepsis Labs: No results for input(s): PROCALCITON, LATICACIDVEN in the last 168 hours.  Recent Results (from the past 240 hour(s))  MRSA PCR Screening     Status: None   Collection Time: 03/31/16  8:45 PM  Result Value Ref Range Status   MRSA by PCR NEGATIVE NEGATIVE Final    Comment:        The GeneXpert MRSA Assay (FDA approved for NASAL specimens only), is one component of a comprehensive MRSA colonization surveillance program. It is not intended to diagnose MRSA infection nor to guide or monitor treatment for MRSA infections.          Radiology Studies: Mr Liver W Wo Contrast  Result Date: 04/02/2016 CLINICAL DATA:  Liver mass, cirrhosis EXAM: MRI ABDOMEN WITHOUT AND WITH CONTRAST TECHNIQUE: Multiplanar multisequence MR imaging of the abdomen was performed both before and after the administration of intravenous contrast. CONTRAST:  35mL MULTIHANCE GADOBENATE DIMEGLUMINE 529 MG/ML IV SOLN COMPARISON:  CT abdomen/pelvis dated 04/01/2016 FINDINGS: Motion degraded images. Postcontrast imaging is particularly limited. Lower chest: Lung bases are clear. Hepatobiliary: Cirrhosis. 4.1 x 5.0 cm  heterogeneously enhancing lesion at the junction of segment 8 and 4A (series 6/ image 6). Additional 2.7 x 2.4 cm enhancing lesion in segment 8 (series 6/ image 11). Enhancement characteristics are difficult to characterize given the motion degradation. Regardless, these are worrisome for multifocal HCC in the setting of cirrhosis. Metastatic disease would be considered less likely. Status post cholecystectomy. No intrahepatic or extrahepatic ductal dilatation. Pancreas:  Poorly visualized. Spleen: Mildly enlarged, measuring 13.9 cm in craniocaudal dimension. Adrenals/Urinary Tract:  Adrenal glands are grossly unremarkable. Kidneys are within normal limits.  No hydronephrosis. Stomach/Bowel: Stomach is within normal limits. Mild wall thickening along the right colon, likely related to venous congestion. Vascular/Lymphatic:  No evidence of abdominal aortic aneurysm.  Gastroesophageal varices (series 6/images 10 and 20). Portal vein is patent. No suspicious abdominal lymphadenopathy. Other:  Small volume abdominal ascites. Musculoskeletal: No focal osseous lesions. IMPRESSION: Motion degraded images. Postcontrast imaging is particularly limited. 4.1 x 5.0 cm enhancing lesion at the junction of segment 8 and 4A. Additional 2.7 x 2.4 cm enhancing lesion in segment 8. While poorly evaluated, these findings are worrisome for multifocal HCC in the setting of cirrhosis. Metastatic disease would be considered less likely. Correlate with AFP. Cirrhosis. Stigmata of portal hypertension, including gastroesophageal varices, splenomegaly, and small volume abdominal ascites. Additional ancillary findings as above. Electronically Signed   By: Julian Hy M.D.   On: 04/02/2016 23:48        Scheduled Meds: . budesonide (PULMICORT) nebulizer solution  0.25 mg Nebulization BID  . clopidogrel  75 mg Oral Daily  . ferrous sulfate  325 mg Oral Daily  . insulin aspart  0-15 Units Subcutaneous TID WC  . insulin aspart  0-5  Units Subcutaneous QHS  . lactulose  20 g Oral TID  . levothyroxine  137 mcg Oral QAC breakfast  . metoprolol  50 mg Oral Daily  . nystatin   Topical TID  . pantoprazole  40 mg Oral Daily  . potassium chloride  10 mEq Oral Daily  . sodium bicarbonate  650 mg Oral BID  . sodium chloride flush  3 mL Intravenous Q12H  . tamsulosin  0.4 mg Oral QPC supper   Continuous Infusions:    LOS: 1 day    Time spent: 62 minutes    Romain Erion, MD [facility] Pager [phone]  If 7PM-7AM, please contact night-coverage [email or web address] Password [email or web address] 04/04/2016, 5:11 PM

## 2016-04-05 DIAGNOSIS — I10 Essential (primary) hypertension: Secondary | ICD-10-CM

## 2016-04-05 LAB — GLUCOSE, CAPILLARY
GLUCOSE-CAPILLARY: 139 mg/dL — AB (ref 65–99)
Glucose-Capillary: 98 mg/dL (ref 65–99)

## 2016-04-05 LAB — COMPREHENSIVE METABOLIC PANEL
ALBUMIN: 2.6 g/dL — AB (ref 3.5–5.0)
ALK PHOS: 93 U/L (ref 38–126)
ALT: 18 U/L (ref 17–63)
AST: 34 U/L (ref 15–41)
Anion gap: 8 (ref 5–15)
BUN: 13 mg/dL (ref 6–20)
CHLORIDE: 112 mmol/L — AB (ref 101–111)
CO2: 19 mmol/L — AB (ref 22–32)
CREATININE: 0.8 mg/dL (ref 0.61–1.24)
Calcium: 8.2 mg/dL — ABNORMAL LOW (ref 8.9–10.3)
GFR calc non Af Amer: >60 mL/min (ref >60)
GLUCOSE: 103 mg/dL — AB (ref 65–99)
Potassium: 3.7 mmol/L (ref 3.5–5.1)
SODIUM: 139 mmol/L (ref 135–145)
Total Bilirubin: 1.5 mg/dL — ABNORMAL HIGH (ref 0.3–1.2)
Total Protein: 5.2 g/dL — ABNORMAL LOW (ref 6.5–8.1)

## 2016-04-05 LAB — AMMONIA: AMMONIA: 59 umol/L — AB (ref 9–35)

## 2016-04-05 MED ORDER — NADOLOL 20 MG PO TABS
20.0000 mg | ORAL_TABLET | Freq: Every day | ORAL | Status: DC
  Filled 2016-04-05: qty 1

## 2016-04-05 MED ORDER — BUDESONIDE-FORMOTEROL FUMARATE 160-4.5 MCG/ACT IN AERO: 2.0000 | INHALATION_SPRAY | Freq: Two times a day (BID) | RESPIRATORY_TRACT | 3 refills | Status: AC

## 2016-04-05 MED ORDER — TIOTROPIUM BROMIDE MONOHYDRATE 18 MCG IN CAPS: 18.0000 ug | ORAL_CAPSULE | Freq: Every day | RESPIRATORY_TRACT | 3 refills | Status: AC

## 2016-04-05 MED ORDER — LACTULOSE 10 GM/15ML PO SOLN: 20.0000 g | Freq: Three times a day (TID) | ORAL | 3 refills | Status: AC

## 2016-04-05 MED ORDER — OXYCODONE HCL 5 MG PO TABS: 5.0000 mg | ORAL_TABLET | Freq: Four times a day (QID) | ORAL | 0 refills | Status: AC | PRN

## 2016-04-05 NOTE — Progress Notes (Signed)
Patient will DC to: [facility] Anticipated DC date: 04/05/16 Family notified: Daughter Transport by: [facility] [employer] ~4pm   Per MD patient ready for DC to ALF. RN, patient, patient's family, and facility notified of DC. Discharge Summary sent to facility. RN given number for report [phone]). DC packet on chart.   CSW signing off.  Ian Cruz, [place] Social Worker [phone]

## 2016-04-05 NOTE — NC FL2 (Signed)
[place] LEVEL OF CARE SCREENING TOOL     IDENTIFICATION  Patient Name: Ian Cruz Birthdate: [birth year] Sex: male Admission Date (Current Location): 03/31/2016  [facility] and [place] Number:  [employer] and Address:  [facility] [employer], [place] [address], [place], [place] [address]      Provider Number: [id number]  Attending Physician Name and Address:  Eugenie Filler, MD  Relative Name and Phone Number:  [name], daughter, [phone]    Current Level of Care: Hospital Recommended Level of Care: [place] Prior Approval Number:    Date Approved/Denied:   PASRR Number:    Discharge Plan: Other (Comment) (ALF)    Current Diagnoses: Patient Active Problem List   Diagnosis Date Noted  . Liver mass   . Abnormal CT of the abdomen   . Altered mental status   . CAD in native artery   . Increased ammonia level   . Uncontrolled type 2 diabetes mellitus with complication ([place])   . Hepatocellular carcinoma ([place])   . Malignant neoplasm of stomach ([place])   . Malignant neoplasm of esophagus ([place])   . Other specified hypothyroidism   . Diabetes ([place])   . Encephalopathy, hepatic ([place])   . Anemia   . Elevated troponin   . Elevated bilirubin   . Inspiratory wheeze on examination   . Absolute anemia   . Coronary artery disease   . Hypertension   . GERD (gastroesophageal reflux disease)   . Hypothyroid   . Stroke ([place])   . MI (myocardial infarction) ([place])   . Left knee DJD   . Neuropathy ([place])   . Chronic low back pain     Orientation RESPIRATION BLADDER Height & Weight     Self, Time, Situation, Place  Normal Continent Weight: (!) 195.1 kg (430 lb 1.9 oz) Height:  5\' 10"  (177.8 cm)  BEHAVIORAL SYMPTOMS/MOOD NEUROLOGICAL BOWEL NUTRITION STATUS      Continent  (Regular)  AMBULATORY STATUS COMMUNICATION OF NEEDS Skin   Limited Assist Verbally Normal                       Personal Care Assistance Level  of Assistance  Bathing, Feeding, Dressing Bathing Assistance: Limited assistance Feeding assistance: Independent Dressing Assistance: Limited assistance     Functional Limitations Info             SPECIAL CARE FACTORS FREQUENCY  PT (By licensed PT)     PT Frequency: 5x/week              Contractures Contractures Info: Not present    Additional Factors Info  Code Status, Allergies, Insulin Sliding Scale Code Status Info: Full Allergies Info: NKA   Insulin Sliding Scale Info: insulin aspart (novoLOG) injection 0-15 Units;insulin aspart (novoLOG) injection 0-5 Units;       Current Medications (04/05/2016):   Discharge Medications: START taking these medications   Details  budesonide-formoterol (SYMBICORT) 160-4.5 MCG/ACT inhaler Inhale 2 puffs into the lungs 2 (two) times daily. Qty: 1 Inhaler, Refills: 3    lactulose (CHRONULAC) 10 GM/15ML solution Take 30 mLs (20 g total) by mouth 3 (three) times daily. Qty: 946 mL, Refills: 3    oxyCODONE (ROXICODONE) 5 MG immediate release tablet Take 1 tablet (5 mg total) by mouth every 6 (six) hours as needed for severe pain. Qty: 20 tablet, Refills: 0    tiotropium (SPIRIVA HANDIHALER) 18 MCG inhalation capsule Place 1 capsule (18  mcg total) into inhaler and inhale daily. Qty: 30 capsule, Refills: 3          CONTINUE these medications which have NOT CHANGED   Details  buPROPion (WELLBUTRIN XL) 150 MG 24 hr tablet Take 150 mg by mouth daily.    ferrous sulfate 325 (65 FE) MG EC tablet Take 325 mg by mouth daily.    glimepiride (AMARYL) 2 MG tablet Take 2 mg by mouth daily before breakfast.    nadolol (CORGARD) 40 MG tablet Take 20 mg by mouth daily.    pantoprazole (PROTONIX) 40 MG tablet Take 40 mg by mouth daily.    PROAIR RESPICLICK [id number] (90 BASE) MCG/ACT AEPB Refills: 0    tamsulosin (FLOMAX) 0.4 MG CAPS capsule     celecoxib (CELEBREX) 200 MG capsule Take 200 mg by mouth 2 (two) times daily.     CHERATUSSIN AC 100-10 MG/5ML syrup Refills: 1    clopidogrel (PLAVIX) 75 MG tablet Take 75 mg by mouth daily.    levothyroxine (SYNTHROID, LEVOTHROID) 137 MCG tablet Take 137 mcg by mouth daily before breakfast.    metFORMIN (GLUCOPHAGE) 500 MG tablet Take 500 mg by mouth 2 (two) times daily with a meal.    potassium chloride (K-DUR) 10 MEQ tablet Take 10 mEq by mouth daily.         STOP taking these medications     finasteride (PROSCAR) 5 MG tablet      polyethylene glycol (MIRALAX / GLYCOLAX) packet      potassium chloride (K-DUR,KLOR-CON) 10 MEQ tablet      potassium chloride (MICRO-K) 10 MEQ CR capsule      furosemide (LASIX) 80 MG tablet      HYDROcodone-acetaminophen (NORCO/VICODIN) 5-325 MG per tablet      levofloxacin (LEVAQUIN) 500 MG tablet      metoprolol (LOPRESSOR) 50 MG tablet        Relevant Imaging Results:  Relevant Lab Results:   Additional Information SSN: [id number] [place] services needed.  Benard Halsted, LCSWA

## 2016-04-05 NOTE — Progress Notes (Signed)
Physical Therapy Treatment Patient Details Name: Ian Cruz MRN: [id number] DOB: Aug 05, 1930 Today's Date: 04/05/2016    History of Present Illness 82 yo male admitted from ALF with AMS and lethargy was diagnosed wiht new cirrhosis, elevated but flat troponins, dementia with acute encephalopathy.  PMH:  OA, CAD, CVA, MI, DM    PT Comments    Patient continues to demonstrate balance, strength, and cognitive impairments and is a high fall risk. Pt will need supervision/assistance for all OOB mobility due to assistance currently needed and history of falls. Current plan remains appropriate.   Follow Up Recommendations  SNF;Supervision/Assistance - 24 hour     Equipment Recommendations  None recommended by PT    Recommendations for Other Services Rehab consult     Precautions / Restrictions Precautions Precautions: Fall Restrictions Weight Bearing Restrictions: No    Mobility  Bed Mobility Overal bed mobility: Needs Assistance Bed Mobility: Supine to Sit     Supine to sit: Min assist     General bed mobility comments: HOB and min use of rail; cues for technique; assist elevate trunk into sitting  Transfers Overall transfer level: Needs assistance Equipment used: Rolling walker (2 wheeled);1 person hand held assist Transfers: Sit to/from [facility] Sit to Stand: Min assist;Mod assist;+2 safety/equipment Stand pivot transfers: Min guard;+2 safety/equipment       General transfer comment: max verbal and tactile cues for hand placement and safe use of AD; pt with tendency to sit prematurely and demonstrated decreased safety awareness; stand pivots X3 with RW two trials and no AD 1 trial; pt educated to use RW with stand pivot when going to w/c as pt required mod A without AD  Ambulation/Gait Ambulation/Gait assistance: Min assist;+2 safety/equipment Ambulation Distance (Feet): 4 Feet Assistive device: Rolling walker (2 wheeled) Gait  Pattern/deviations: Step-through pattern;Decreased dorsiflexion - right;Decreased dorsiflexion - left;Decreased stride length;Trunk flexed     General Gait Details: assist for balance and RW management   Stairs            Wheelchair Mobility    Modified Rankin (Stroke Patients Only)       Balance                                    Cognition Arousal/Alertness: Awake/alert Behavior During Therapy: WFL for tasks assessed/performed Overall Cognitive Status: History of cognitive impairments - at baseline       Memory: Decreased recall of precautions;Decreased short-term memory              Exercises      General Comments        Pertinent Vitals/Pain Pain Assessment: No/denies pain    Home Living                      Prior Function            PT Goals (current goals can now be found in the care plan section) Acute Rehab PT Goals Patient Stated Goal: go home PT Goal Formulation: With patient Time For Goal Achievement: 04/15/16 Potential to Achieve Goals: Good Progress towards PT goals: Progressing toward goals    Frequency    Min 2X/week      PT Plan Current plan remains appropriate    Co-evaluation             End of Session Equipment Utilized During Treatment: Gait belt Activity Tolerance: Patient  tolerated treatment well Patient left: in chair;with call bell/phone within reach;with chair alarm set;with family/visitor present     Time: 1040-1109 PT Time Calculation (min) (ACUTE ONLY): 29 min  Charges:  $Therapeutic Activity: 23-37 mins                    G Codes:      [place] April, PTA Pager: [phone]   04/05/2016, 11:30 AM

## 2016-04-05 NOTE — Consult Note (Signed)
[facility] CM Primary Care Navigator  04/05/2016  Ian Cruz 1931/02/03 [id number]  Met with patient at the bedside to identify possible discharge needs. Patient's primary care provider as listed is Dr. Carlus Pavlov at [facility]) but patient shares that PCP had retired early this year and is seeing a lady primary care provider whose name he was unable to recall.    Patient used to obtain medications from [facility] but he now resides at the [facility] [place] facility who also manages his medications as stated.   Plan was to discharge to the same assisted living facility as above [facility]).  Patient expressed understanding to see  primary care provider for a post discharge follow-up within one to two weeks or sooner if needed.  For additional questions please contact:  Edwena Felty A. Cregg Jutte, BSN, RN-BC [facility] PRIMARY CARE Navigator Cell: [phone]

## 2016-04-05 NOTE — Discharge Summary (Signed)
Physician Discharge Summary  Ian Cruz [id number] DOB: 1930-09-03 DOA: 03/31/2016  PCP: Carlus Pavlov, MD  Admit date: 03/31/2016 Discharge date: 04/05/2016  Time spent: 65 minutes  Recommendations for Outpatient Follow-up:  1. Follow-up with Dr. Bobby Rumpf, oncology for further evaluation and management of newly diagnosed hepatocellular carcinoma. 2. Follow-up with CAMERON,JOHN, MD A1 to 2 weeks. On follow-up patient will need a basic metabolic profile done to follow-up on electrolytes and renal function. Patient's hepatic encephalopathy and cirrhosis will need to be reassessed at that time.   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 male with medical history significant for hypertension, stroke, hypothyroid, GERD, CAD, diabetes, dementia presented to the emergency Department chief complaint acute encephalopathy. Initial evaluation revealled elevated troponin, mildly elevated total bilirubin mildly elevated ammonia level.  Information was obtained from the patient and the son who was at the bedside noting that information from patient may be  unreliable due to dementia. He stated that 3 days prior to admission,  he fell while at church. He was taken to [facility] emergency department status post fall and also complaining of acute encephalopathy. It was presumed meclizine contributed to this and was discontinued. Reportedly he did well until 2 days PTA, when he gradually became more and more confused. The morning of admission, he would not speak and could not remember staff's name. He had no recollection of details but remembers falling while going to the bathroom at church. He did complain of a sore "tailbone". He denied any recent illness fever chills nausea vomiting diarrhea constipation. He did report some "dark" stool but denied bright red blood per rectum. He denied headache dizziness syncope or near-syncope. He denied chest pain palpitation did report some increased shortness of breath and wheezing over the last 2 days. As a former smoker but is not on oxygen at home. He reported his "daughter noticed the wheezing several days ago and got me some medicine".    ED Course: In emergency department he's afebrile hemodynamically stable with a heart rate below end of normal he is not hypoxic he is provided with IV fluids and encephalopathy improved Hospital Course:  #1 acute hepatic encephalopathy Likely secondary to her hepatic encephalopathy as CT abdomen and pelvis showing cirrhosis and probable mass in the dome of the liver concerning for multifocal hepatocellular carcinoma. Ammonia levels were elevated. Patient was placed on lactulose 20 g 3 times daily with clinical improvement. Patient be discharged in current dose of lactulose 20 g 3 times daily with a goal bowel movements of 3-5 per day. Outpatient follow-up.   #2 abnormal CT  scan/mass in the dome of the liver/Probable newly diagnosed hepatocellular carcinoma ([place]) lesions. Per CT scan noted. Concern for hepatocellular carcinoma versus metastatic disease. AFP elevated at 364.6. Due  to patient's prior history of gastric cancer and concern for mass in the distal esophagus, gastroenterology was consulted. MRI of the abdomen was recommended which was done 04/02/2016 which showed a 4.1 x 5.0 cm enhancing lesion and a 2.7 x 2.4 cm enhancing lesion worrisome for multifocal HCC in the setting of cirrhosis. Metastatic disease to be considered less likely. AFP which was obtained elevated at 364.6. GI felt lesion of the lower esophagus was a collection of varices and not a solid mass and as such no need for endoscopic intervention at this time. Patient be discharged back to his assisted living facility and is to follow-up with his oncologist, Dr. Bobby Rumpf in one week for further recommendations and options.   #3 gastric varices/esophageal varices Patient was on nadolol. Lopressor prior to admission. Patient was maintained on metoprolol during the hospitalization. On day of discharge metoprolol has been discontinued and patient will be resumed back on home dose nadolol.   #4 elevated troponins/coronary artery disease Patient noted to have an initial troponin of 0.06. EKG with no ischemic changes. Chest x-ray unremarkable. Patient asymptomatic. No need for any further evaluation at this time.  #5 elevated ammonia levels Likely secondary to hepatic encephalopathy as patient noted to have cirrhosis. Patient was started on lactulose. Ammonia levels trended down.  #6 anemia Stable. No overt bleeding. Follow H&H.  #7 well-controlled type 2 diabetes mellitus Hemoglobin A1c is 4.9. CBGs were well controlled during the hospitalization on sliding scale insulin. Oral hypoglycemic agents were held and will be resumed on discharge.   #8 hypertension Stable. Continue metoprolol during the hospitalization which was changed to nadolol on discharge.   #9 hypothyroidism TSH within normal limits of 0.583. Continued on home dose Synthroid.  #10 hypokalemia Repleted.    Procedures:  CT  chest 04/01/2016  CT abdomen and pelvis 04/01/2016  CT head 03/31/2016  Chest x-ray 03/31/2016  Plain films of the sacrum/coccyx 03/31/2016  MRI liver 04/02/2016   Consultations:  GI: Dr Alessandra Bevels 04/02/2016  Discharge Exam: Vitals:   04/05/16 0629 04/05/16 1042  BP: (!) 99/51 (!) 127/50  Pulse: (!) 55 (!) 57  Resp: 16   Temp: 97.5 F (36.4 C)    General: NAD Cardiovascular: RRR Respiratory: CTAB  Discharge Instructions   Discharge Instructions    Diet - low sodium heart healthy    Complete by:  As directed    Discharge instructions    Complete by:  As directed    Follow up with Dr Bobby Rumpf in 1 week. Follow up with CAMERON,JOHN, MD in 1-2 weeks.   Increase activity slowly    Complete by:  As directed      Current Discharge Medication List    START taking these medications   Details  budesonide-formoterol (SYMBICORT) 160-4.5 MCG/ACT inhaler Inhale 2 puffs into the lungs 2 (two) times daily. Qty: 1 Inhaler, Refills: 3    lactulose (CHRONULAC) 10 GM/15ML solution Take 30 mLs (20 g total) by mouth 3 (three) times daily. Qty: 946 mL, Refills: 3    oxyCODONE (ROXICODONE) 5 MG immediate release tablet Take 1 tablet (5 mg total) by mouth every 6 (six) hours as needed for severe pain. Qty: 20 tablet, Refills: 0    tiotropium (SPIRIVA HANDIHALER) 18 MCG inhalation capsule Place 1 capsule (18 mcg total) into inhaler and inhale daily.  Qty: 30 capsule, Refills: 3      CONTINUE these medications which have NOT CHANGED   Details  buPROPion (WELLBUTRIN XL) 150 MG 24 hr tablet Take 150 mg by mouth daily.    ferrous sulfate 325 (65 FE) MG EC tablet Take 325 mg by mouth daily.    glimepiride (AMARYL) 2 MG tablet Take 2 mg by mouth daily before breakfast.    nadolol (CORGARD) 40 MG tablet Take 20 mg by mouth daily.    pantoprazole (PROTONIX) 40 MG tablet Take 40 mg by mouth daily.    PROAIR RESPICLICK 123XX123 (90 BASE) MCG/ACT AEPB Refills: 0    tamsulosin (FLOMAX)  0.4 MG CAPS capsule     celecoxib (CELEBREX) 200 MG capsule Take 200 mg by mouth 2 (two) times daily.    CHERATUSSIN AC 100-10 MG/5ML syrup Refills: 1    clopidogrel (PLAVIX) 75 MG tablet Take 75 mg by mouth daily.    levothyroxine (SYNTHROID, LEVOTHROID) 137 MCG tablet Take 137 mcg by mouth daily before breakfast.    metFORMIN (GLUCOPHAGE) 500 MG tablet Take 500 mg by mouth 2 (two) times daily with a meal.    potassium chloride (K-DUR) 10 MEQ tablet Take 10 mEq by mouth daily.      STOP taking these medications     finasteride (PROSCAR) 5 MG tablet      polyethylene glycol (MIRALAX / GLYCOLAX) packet      potassium chloride (K-DUR,KLOR-CON) 10 MEQ tablet      potassium chloride (MICRO-K) 10 MEQ CR capsule      furosemide (LASIX) 80 MG tablet      HYDROcodone-acetaminophen (NORCO/VICODIN) 5-325 MG per tablet      levofloxacin (LEVAQUIN) 500 MG tablet      metoprolol (LOPRESSOR) 50 MG tablet        No Known Allergies Follow-up Information    CAMERON,JOHN, MD. Schedule an appointment as soon as possible for a visit in 2 week(s).   Specialty:  Family Medicine Contact information: [address] [place] [address] [phone]        LEWIS,DEQUINCY A., MD. Schedule an appointment as soon as possible for a visit in 1 week(s).   Specialty:  Oncology Contact information: [address]. [place] [address] [phone]            The results of significant diagnostics from this hospitalization (including imaging, microbiology, ancillary and laboratory) are listed below for reference.    Significant Diagnostic Studies: Dg Chest 2 View  Result Date: 03/31/2016 CLINICAL DATA:  [place] mental status today, history of diabetes and gastric carcinoma, fell several days ago EXAM: CHEST  2 VIEW COMPARISON:  Chest x-ray 03/28/2016 FINDINGS: No active infiltrate or effusion is seen. The lungs appear slightly better aerated. The heart is mildly enlarged and stable.  Median sternotomy sutures are noted from prior CABG. IMPRESSION: No active lung disease. Electronically Signed   By: Ivar Drape M.D.   On: 03/31/2016 12:12   Dg Sacrum/coccyx  Result Date: 03/31/2016 CLINICAL DATA:  [place] several days ago with pain in the sacrococcygeal region, altered mental status today EXAM: SACRUM AND COCCYX - 2+ VIEW COMPARISON:  CT abdomen pelvis of 12/02/2013 FINDINGS: The bones are diffusely osteopenic. The sacrococcygeal elements are in normal alignment. No acute sacral fracture is seen. The sacral foramina appear corticated in the SI joints are unremarkable. The pelvic rami are intact. IMPRESSION: Osteopenia.  No acute fracture. Electronically Signed   By: Windy Canny.D.  On: 03/31/2016 12:13   Ct Head Wo Contrast  Result Date: 03/31/2016 CLINICAL DATA:  Altered mental status, fall 3 days ago, right lower extremity weakness EXAM: CT HEAD WITHOUT CONTRAST TECHNIQUE: Contiguous axial images were obtained from the base of the skull through the vertex without intravenous contrast. COMPARISON:  03/28/2016 FINDINGS: Brain: No intracranial hemorrhage, mass effect or midline shift. No acute cortical infarction. No mass lesion is noted on this unenhanced scan. Mild cerebral atrophy. Vascular: Mild atherosclerotic calcifications of carotid siphon. Skull: No skull fracture is noted. Sinuses/Orbits: No acute finding. Other: None. IMPRESSION: No acute intracranial abnormality. Atherosclerotic calcifications of carotid siphon again noted. Mild cerebral atrophy again noted. Electronically Signed   By: Lahoma Crocker M.D.   On: 03/31/2016 11:49   Ct Chest W Contrast  Result Date: 04/01/2016 CLINICAL DATA:  Altered mental status and patient with elevated ammonia and bilirubin. History of gastric carcinoma. Question metastatic disease. EXAM: CT CHEST, ABDOMEN, AND PELVIS WITH CONTRAST TECHNIQUE: Multidetector CT imaging of the chest, abdomen and pelvis was performed following the standard  protocol during bolus administration of intravenous contrast. CONTRAST:  100 ml ISOVUE-300 IOPAMIDOL (ISOVUE-300) INJECTION 61% COMPARISON:  CT abdomen and pelvis 12/02/2013. FINDINGS: CT CHEST FINDINGS Cardiovascular: Calcific aortic and coronary atherosclerosis is identified. Heart size is upper normal. No pericardial effusion. Mediastinum/Nodes: No axillary or supraclavicular lymphadenopathy. Subcarinal lymph node on image 24 measures 1.9 cm short axis dimension. Lesion which appears to be a mass along the distal esophagus near the gastroesophageal junction is multiple varices as seen on the prior CT abdomen and pelvis. No other lymphadenopathy is identified. Lungs/Pleura: No pleural effusion. No nodule or mass. There is scattered tears of mild dependent atelectasis is noted. Musculoskeletal: No lytic or sclerotic lesion is identified. Scattered Schmorl's nodes in the thoracic spine are noted. CT ABDOMEN PELVIS FINDINGS Hepatobiliary: The liver is markedly shrunken with a nodular border consistent with cirrhosis. Multiple esophageal and gastric varices are seen. There is a lesion in the dome of the liver which is hypoattenuating and measures 3.4 cm AP by 3.4 cm transverse on image 36 by 2.8 cm craniocaudal. A subtle focus of hyperattenuation in this location on the prior CT abdomen and pelvis measured 1.7 cm. Pancreas: Atrophic but otherwise unremarkable. Spleen: There is splenomegaly with the spleen measuring approximately 15 cm craniocaudal. Adrenals/Urinary Tract: Unremarkable. Stomach/Bowel: Gastrojejunostomy is again seen as on the prior examination. There is thickening of the walls of the ascending colon likely related to the patient's cirrhosis. The colon is otherwise unremarkable. The appendix has been removed. No small bowel obstruction. Vascular/Lymphatic: Aortoiliac atherosclerosis without aneurysm is identified. No lymphadenopathy is seen. Reproductive: There is mild prostatomegaly.  Otherwise  unremarkable. Other: Small volume of abdominal and pelvic ascites is seen. No focal fluid collection. Musculoskeletal: Scattered Schmorl's nodes are seen. Mild anterior, inferior endplate compression fracture of T11 and biconcave compression fracture of T12 are unchanged. Postoperative change lower lumbar spine noted. Degenerative disease about the hips is worse on the right. IMPRESSION: Markedly cirrhotic liver with portal hypertension including extensive gastric and esophageal variceal formation. Hypoattenuating lesion in the dome of the liver is identified and worrisome for hepatocellular carcinoma. Metastatic disease is also possible in this patient with a history of gastric carcinoma. When the patient is able to cooperate with breathing instructions, MRI of the abdomen with and without contrast could be used for further evaluation. Small volume of abdominal and pelvic ascites. Calcific aortic and coronary atherosclerosis. Electronically Signed   By: Marcello Moores  Dalessio M.D.   On: 04/01/2016 16:10   Ct Abdomen Pelvis W Contrast  Result Date: 04/01/2016 CLINICAL DATA:  Altered mental status and patient with elevated ammonia and bilirubin. History of gastric carcinoma. Question metastatic disease. EXAM: CT CHEST, ABDOMEN, AND PELVIS WITH CONTRAST TECHNIQUE: Multidetector CT imaging of the chest, abdomen and pelvis was performed following the standard protocol during bolus administration of intravenous contrast. CONTRAST:  100 ml ISOVUE-300 IOPAMIDOL (ISOVUE-300) INJECTION 61% COMPARISON:  CT abdomen and pelvis 12/02/2013. FINDINGS: CT CHEST FINDINGS Cardiovascular: Calcific aortic and coronary atherosclerosis is identified. Heart size is upper normal. No pericardial effusion. Mediastinum/Nodes: No axillary or supraclavicular lymphadenopathy. Subcarinal lymph node on image 24 measures 1.9 cm short axis dimension. Lesion which appears to be a mass along the distal esophagus near the gastroesophageal junction is  multiple varices as seen on the prior CT abdomen and pelvis. No other lymphadenopathy is identified. Lungs/Pleura: No pleural effusion. No nodule or mass. There is scattered tears of mild dependent atelectasis is noted. Musculoskeletal: No lytic or sclerotic lesion is identified. Scattered Schmorl's nodes in the thoracic spine are noted. CT ABDOMEN PELVIS FINDINGS Hepatobiliary: The liver is markedly shrunken with a nodular border consistent with cirrhosis. Multiple esophageal and gastric varices are seen. There is a lesion in the dome of the liver which is hypoattenuating and measures 3.4 cm AP by 3.4 cm transverse on image 36 by 2.8 cm craniocaudal. A subtle focus of hyperattenuation in this location on the prior CT abdomen and pelvis measured 1.7 cm. Pancreas: Atrophic but otherwise unremarkable. Spleen: There is splenomegaly with the spleen measuring approximately 15 cm craniocaudal. Adrenals/Urinary Tract: Unremarkable. Stomach/Bowel: Gastrojejunostomy is again seen as on the prior examination. There is thickening of the walls of the ascending colon likely related to the patient's cirrhosis. The colon is otherwise unremarkable. The appendix has been removed. No small bowel obstruction. Vascular/Lymphatic: Aortoiliac atherosclerosis without aneurysm is identified. No lymphadenopathy is seen. Reproductive: There is mild prostatomegaly.  Otherwise unremarkable. Other: Small volume of abdominal and pelvic ascites is seen. No focal fluid collection. Musculoskeletal: Scattered Schmorl's nodes are seen. Mild anterior, inferior endplate compression fracture of T11 and biconcave compression fracture of T12 are unchanged. Postoperative change lower lumbar spine noted. Degenerative disease about the hips is worse on the right. IMPRESSION: Markedly cirrhotic liver with portal hypertension including extensive gastric and esophageal variceal formation. Hypoattenuating lesion in the dome of the liver is identified and  worrisome for hepatocellular carcinoma. Metastatic disease is also possible in this patient with a history of gastric carcinoma. When the patient is able to cooperate with breathing instructions, MRI of the abdomen with and without contrast could be used for further evaluation. Small volume of abdominal and pelvic ascites. Calcific aortic and coronary atherosclerosis. Electronically Signed   By: Inge Rise M.D.   On: 04/01/2016 16:10   Mr Liver W Wo Contrast  Result Date: 04/02/2016 CLINICAL DATA:  Liver mass, cirrhosis EXAM: MRI ABDOMEN WITHOUT AND WITH CONTRAST TECHNIQUE: Multiplanar multisequence MR imaging of the abdomen was performed both before and after the administration of intravenous contrast. CONTRAST:  79mL MULTIHANCE GADOBENATE DIMEGLUMINE 529 MG/ML IV SOLN COMPARISON:  CT abdomen/pelvis dated 04/01/2016 FINDINGS: Motion degraded images. Postcontrast imaging is particularly limited. Lower chest: Lung bases are clear. Hepatobiliary: Cirrhosis. 4.1 x 5.0 cm heterogeneously enhancing lesion at the junction of segment 8 and 4A (series 6/ image 6). Additional 2.7 x 2.4 cm enhancing lesion in segment 8 (series 6/ image 11). Enhancement characteristics are difficult to  characterize given the motion degradation. Regardless, these are worrisome for multifocal HCC in the setting of cirrhosis. Metastatic disease would be considered less likely. Status post cholecystectomy. No intrahepatic or extrahepatic ductal dilatation. Pancreas:  Poorly visualized. Spleen: Mildly enlarged, measuring 13.9 cm in craniocaudal dimension. Adrenals/Urinary Tract:  Adrenal glands are grossly unremarkable. Kidneys are within normal limits.  No hydronephrosis. Stomach/Bowel: Stomach is within normal limits. Mild wall thickening along the right colon, likely related to venous congestion. Vascular/Lymphatic:  No evidence of abdominal aortic aneurysm. Gastroesophageal varices (series 6/images 10 and 20). Portal vein is patent.  No suspicious abdominal lymphadenopathy. Other:  Small volume abdominal ascites. Musculoskeletal: No focal osseous lesions. IMPRESSION: Motion degraded images. Postcontrast imaging is particularly limited. 4.1 x 5.0 cm enhancing lesion at the junction of segment 8 and 4A. Additional 2.7 x 2.4 cm enhancing lesion in segment 8. While poorly evaluated, these findings are worrisome for multifocal HCC in the setting of cirrhosis. Metastatic disease would be considered less likely. Correlate with AFP. Cirrhosis. Stigmata of portal hypertension, including gastroesophageal varices, splenomegaly, and small volume abdominal ascites. Additional ancillary findings as above. Electronically Signed   By: Julian Hy M.D.   On: 04/02/2016 23:48    Microbiology: Recent Results (from the past 240 hour(s))  MRSA PCR Screening     Status: None   Collection Time: 03/31/16  8:45 PM  Result Value Ref Range Status   MRSA by PCR NEGATIVE NEGATIVE Final    Comment:        The GeneXpert MRSA Assay (FDA approved for NASAL specimens only), is one component of a comprehensive MRSA colonization surveillance program. It is not intended to diagnose MRSA infection nor to guide or monitor treatment for MRSA infections.      Labs: Basic Metabolic Panel:  Recent Labs Lab 04/01/16 0438 04/02/16 0632 04/03/16 0500 04/04/16 0522 04/05/16 0649  NA 143 138 141 139 139  K 3.2* 4.5 4.2 3.8 3.7  CL 111 109 112* 114* 112*  CO2 24 23 20* 18* 19*  GLUCOSE 93 127* 146* 120* 103*  BUN 11 13 15 16 13   CREATININE 0.94 0.96 0.96 0.80 0.80  CALCIUM 8.2* 8.3* 8.4* 8.4* 8.2*  MG  --  2.1  --   --   --    Liver Function Tests:  Recent Labs Lab 03/31/16 1133 04/03/16 0500 04/04/16 0522 04/05/16 0649  AST 42* 36 32 34  ALT 18 17 18 18   ALKPHOS 106 98 99 93  BILITOT 1.7* 1.5* 1.7* 1.5*  PROT 5.3* 5.3* 5.5* 5.2*  ALBUMIN 2.6* 2.5* 2.6* 2.6*   No results for input(s): LIPASE, AMYLASE in the last 168  hours.  Recent Labs Lab 04/01/16 0438 04/02/16 0632 04/03/16 0500 04/04/16 0522 04/05/16 0649  AMMONIA 89* 74* 101* 57* 59*   CBC:  Recent Labs Lab 03/31/16 1133 03/31/16 1911 04/01/16 0438 04/02/16 0632 04/03/16 0500 04/04/16 0522  WBC 2.2*  --  2.6* 2.9* 2.5* 3.5*  NEUTROABS 1.5*  --   --  2.0  --   --   HGB 9.4*  --  9.4* 9.5* 9.5* 9.5*  HCT 29.4* 32.3* 28.8* 29.8* 29.8* 29.9*  MCV 97.4  --  96.3 98.0 98.3 98.7  PLT 70*  --  59* 62* 60* 78*   Cardiac Enzymes:  Recent Labs Lab 03/31/16 1133 03/31/16 1911 04/01/16 0438  TROPONINI 0.06* 0.06* 0.06*   BNP: BNP (last 3 results) No results for input(s): BNP in the last 8760 hours.  ProBNP (  last 3 results) No results for input(s): PROBNP in the last 8760 hours.  CBG:  Recent Labs Lab 04/04/16 1201 04/04/16 1652 04/04/16 2122 04/04/16 2349 04/05/16 0813  GLUCAP 148* 125* 84 126* 98       Signed:  Abdurahman Rugg MD.  [facility] 04/05/2016, 10:57 AM

## 2016-04-05 NOTE — Care Management Note (Addendum)
Case Management Note  Patient Details  Name: Ian Cruz MRN: [id number] Date of Birth: 30-Dec-1930  Subjective/Objective:                    Action/Plan: Plan is to d/c to ALF with home health services.  Expected Discharge Date:   04/05/2016           Expected Discharge Plan:  Assisted Living / [place] ([place])  In-House Referral:  Clinical Social Work  Discharge planning Services  CM Consult  Post Acute Care Choice:    Choice offered to:     DME Arranged:    DME Agency:     HH Arranged:   ALF to arranged home health services   PT    [place] work    [place] faxed home health orders to Nurse Joy @ [facility] @ [phone].  [place] Agency:  [facility]  Status of Service:  Completed, signed off  If discussed at [place] of Stay Meetings, dates discussed:    Additional Comments:  Sharin Mons, RN 04/05/2016, 11:23 AM

## 2016-04-08 ENCOUNTER — Encounter (INDEPENDENT_AMBULATORY_CARE_PROVIDER_SITE_OTHER): Payer: Medicare Other | Admitting: Ophthalmology

## 2016-04-16 ENCOUNTER — Encounter (INDEPENDENT_AMBULATORY_CARE_PROVIDER_SITE_OTHER): Payer: Medicare Other | Admitting: Ophthalmology

## 2016-04-26 DIAGNOSIS — R188 Other ascites: Secondary | ICD-10-CM

## 2016-04-27 DIAGNOSIS — D693 Immune thrombocytopenic purpura: Secondary | ICD-10-CM

## 2016-04-27 DIAGNOSIS — K7581 Nonalcoholic steatohepatitis (NASH): Secondary | ICD-10-CM

## 2016-04-27 DIAGNOSIS — R0600 Dyspnea, unspecified: Secondary | ICD-10-CM

## 2016-04-27 DIAGNOSIS — R188 Other ascites: Secondary | ICD-10-CM

## 2016-04-27 DIAGNOSIS — E119 Type 2 diabetes mellitus without complications: Secondary | ICD-10-CM

## 2016-04-27 DIAGNOSIS — D61818 Other pancytopenia: Secondary | ICD-10-CM

## 2016-04-27 DIAGNOSIS — R531 Weakness: Secondary | ICD-10-CM | POA: Diagnosis not present

## 2016-04-28 DIAGNOSIS — E119 Type 2 diabetes mellitus without complications: Secondary | ICD-10-CM | POA: Diagnosis not present

## 2016-04-28 DIAGNOSIS — R531 Weakness: Secondary | ICD-10-CM | POA: Diagnosis not present

## 2016-04-28 DIAGNOSIS — R0600 Dyspnea, unspecified: Secondary | ICD-10-CM | POA: Diagnosis not present

## 2016-04-28 DIAGNOSIS — R188 Other ascites: Secondary | ICD-10-CM | POA: Diagnosis not present

## 2016-05-03 DIAGNOSIS — D61818 Other pancytopenia: Secondary | ICD-10-CM | POA: Diagnosis not present

## 2016-05-03 DIAGNOSIS — R161 Splenomegaly, not elsewhere classified: Secondary | ICD-10-CM | POA: Diagnosis not present

## 2016-05-03 DIAGNOSIS — K7460 Unspecified cirrhosis of liver: Secondary | ICD-10-CM | POA: Diagnosis not present

## 2016-05-13 DIAGNOSIS — E119 Type 2 diabetes mellitus without complications: Secondary | ICD-10-CM

## 2016-05-13 DIAGNOSIS — D61818 Other pancytopenia: Secondary | ICD-10-CM

## 2016-05-13 DIAGNOSIS — I2510 Atherosclerotic heart disease of native coronary artery without angina pectoris: Secondary | ICD-10-CM

## 2016-05-13 DIAGNOSIS — K7581 Nonalcoholic steatohepatitis (NASH): Secondary | ICD-10-CM

## 2016-05-13 DIAGNOSIS — N390 Urinary tract infection, site not specified: Secondary | ICD-10-CM

## 2016-05-13 DIAGNOSIS — R188 Other ascites: Secondary | ICD-10-CM | POA: Diagnosis not present

## 2016-05-13 DIAGNOSIS — R601 Generalized edema: Secondary | ICD-10-CM

## 2016-05-13 DIAGNOSIS — K7290 Hepatic failure, unspecified without coma: Secondary | ICD-10-CM

## 2016-05-14 DIAGNOSIS — R188 Other ascites: Secondary | ICD-10-CM | POA: Diagnosis not present

## 2016-05-14 DIAGNOSIS — K7290 Hepatic failure, unspecified without coma: Secondary | ICD-10-CM | POA: Diagnosis not present

## 2016-05-14 DIAGNOSIS — R601 Generalized edema: Secondary | ICD-10-CM | POA: Diagnosis not present

## 2016-05-14 DIAGNOSIS — N390 Urinary tract infection, site not specified: Secondary | ICD-10-CM | POA: Diagnosis not present

## 2016-05-15 DIAGNOSIS — K7290 Hepatic failure, unspecified without coma: Secondary | ICD-10-CM | POA: Diagnosis not present

## 2016-05-15 DIAGNOSIS — R601 Generalized edema: Secondary | ICD-10-CM | POA: Diagnosis not present

## 2016-05-15 DIAGNOSIS — N390 Urinary tract infection, site not specified: Secondary | ICD-10-CM | POA: Diagnosis not present

## 2016-05-15 DIAGNOSIS — R188 Other ascites: Secondary | ICD-10-CM | POA: Diagnosis not present

## 2016-05-16 DIAGNOSIS — K7290 Hepatic failure, unspecified without coma: Secondary | ICD-10-CM | POA: Diagnosis not present

## 2016-05-16 DIAGNOSIS — R188 Other ascites: Secondary | ICD-10-CM | POA: Diagnosis not present

## 2016-05-16 DIAGNOSIS — R601 Generalized edema: Secondary | ICD-10-CM | POA: Diagnosis not present

## 2016-05-16 DIAGNOSIS — N390 Urinary tract infection, site not specified: Secondary | ICD-10-CM | POA: Diagnosis not present

## 2016-05-17 DIAGNOSIS — N390 Urinary tract infection, site not specified: Secondary | ICD-10-CM | POA: Diagnosis not present

## 2016-05-17 DIAGNOSIS — R601 Generalized edema: Secondary | ICD-10-CM | POA: Diagnosis not present

## 2016-05-17 DIAGNOSIS — K7290 Hepatic failure, unspecified without coma: Secondary | ICD-10-CM | POA: Diagnosis not present

## 2016-05-17 DIAGNOSIS — R188 Other ascites: Secondary | ICD-10-CM | POA: Diagnosis not present

## 2016-05-26 ENCOUNTER — Encounter (INDEPENDENT_AMBULATORY_CARE_PROVIDER_SITE_OTHER): Payer: Medicare Other | Admitting: Ophthalmology

## 2016-07-12 DEATH — deceased

## 2016-08-12 DEATH — deceased

## 2018-07-27 IMAGING — MR MR ABDOMEN WO/W CM
15 of 19 series · 33 of 48 positions shown · IV contrast (17 MH)
Comparison: CT abdomen/pelvis dated 04/01/2016

CLINICAL DATA: Liver mass, cirrhosis

EXAM:
MRI ABDOMEN WITHOUT AND WITH CONTRAST
TECHNIQUE: Multiplanar multisequence MR imaging of the abdomen was performed
both before and after the administration of intravenous contrast.
CONTRAST:  17mL MULTIHANCE GADOBENATE DIMEGLUMINE 529 MG/ML IV SOLN

[Series 4: cor ssfse nav · coronal · 6.0mm · 0.86mm/px · 2 of 42 slices shown]
[im 1/42]
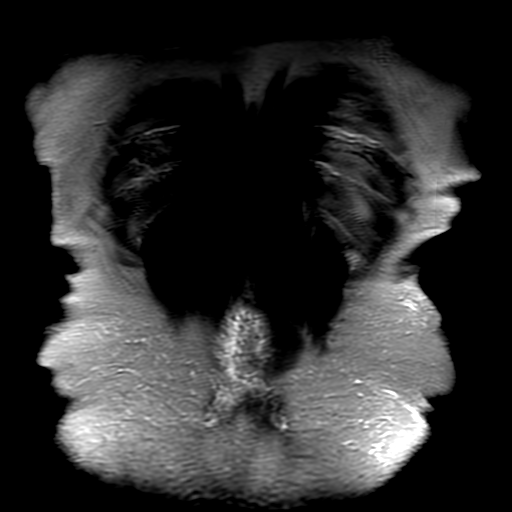
[im 42/42]
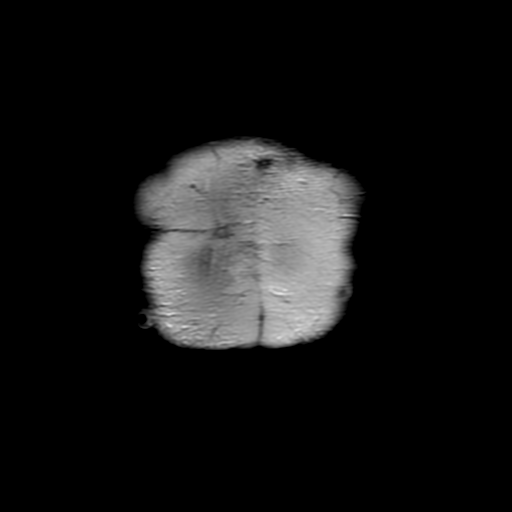

[Series 5: ax ssfse nav · axial · 6.0mm · 0.94mm/px · 1 of 49 slices shown]
[im 1/49]
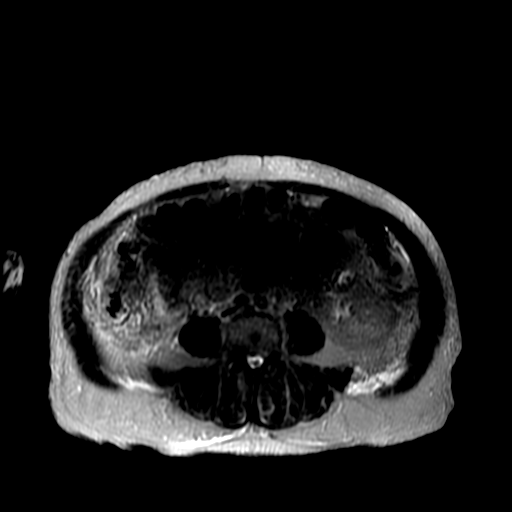

[Series 6: T2 fat-sat · axial · 6.0mm · 0.94mm/px · 1 of 45 slices shown]
[im 1/45]
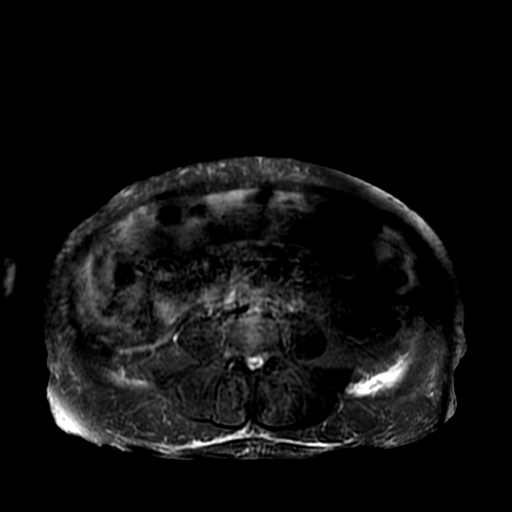

[Series 8: DWI b500 · axial · 8.0mm · 1.95mm/px · 1 of 58 slices shown]
[im 1/58]
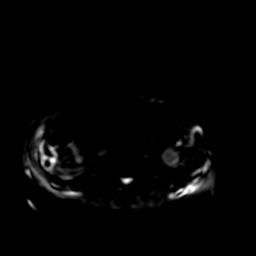

[Series 12: bSSFP · axial · 6.0mm · 0.94mm/px · 1 of 50 slices shown]
[im 1/50]
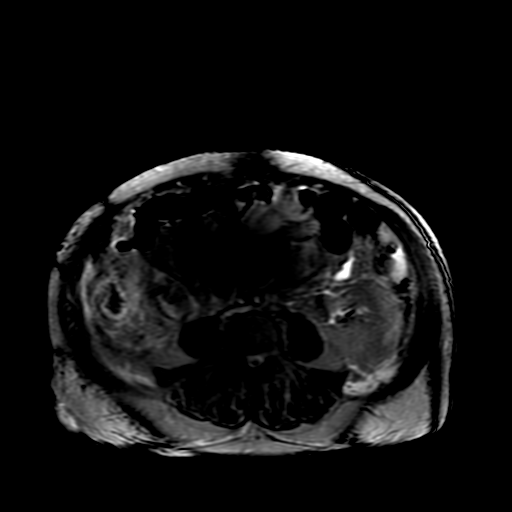

[Series 850: ADC · axial · 8.0mm · 1.95mm/px · 1 of 29 slices shown]
[im 1/29]
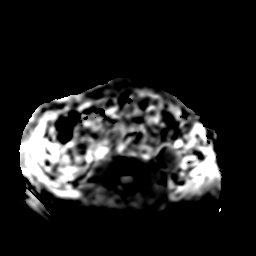

[Series 1301: T1 dynamic post-contrast · axial · non-contrast · 4.4mm · 1.88mm/px · z∈[-74,+223]mm · 3 of 136 slices shown (1 of 5)]
[im 1/136]
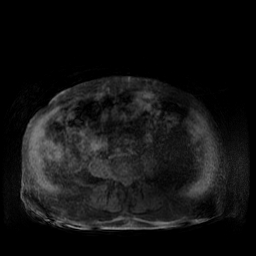
[im 68/136]
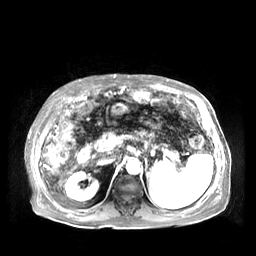
[im 136/136]
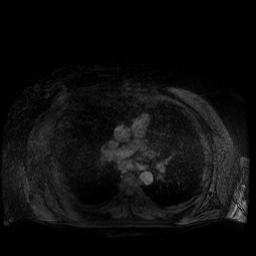

[Series 1302: T1 dynamic post-contrast · axial · non-contrast · 4.4mm · 1.88mm/px · z∈[-74,+223]mm · 3 of 136 slices shown (2 of 5)]
[im 1/136]
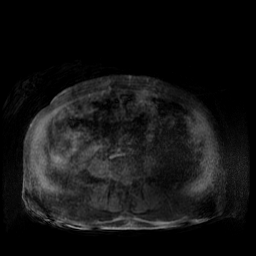
[im 68/136]
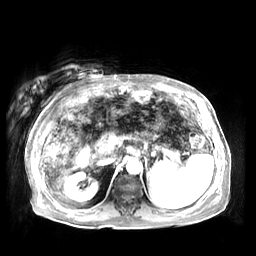
[im 136/136]
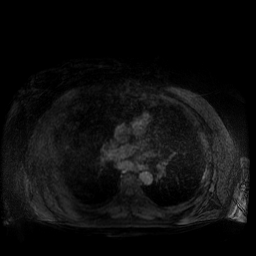

[Series 1303: T1 dynamic post-contrast · axial · non-contrast · 4.4mm · 1.88mm/px · z∈[-74,+223]mm · 3 of 136 slices shown (3 of 5)]
[im 1/136]
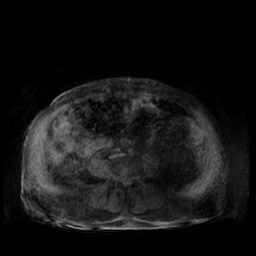
[im 68/136]
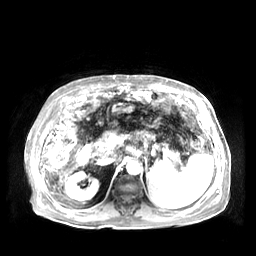
[im 136/136]
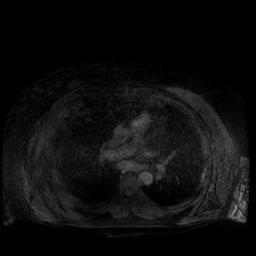

[Series 1304: T1 dynamic post-contrast · axial · non-contrast · 4.4mm · 1.88mm/px · z∈[-74,+223]mm · 3 of 136 slices shown (4 of 5)]
[im 1/136]
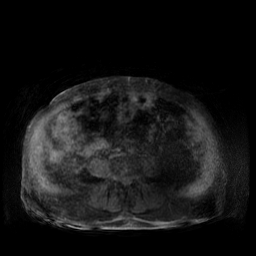
[im 68/136]
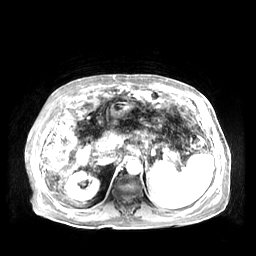
[im 136/136]
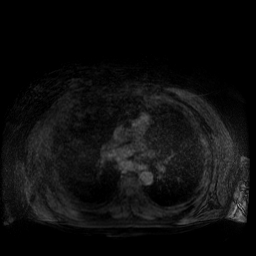

[Series 1305: T1 dynamic post-contrast · axial · non-contrast · 20.0mm · 3.25mm/px · z∈[+75,+632]mm · 2 of 98 slices shown (5 of 5)]
[im 1/98]
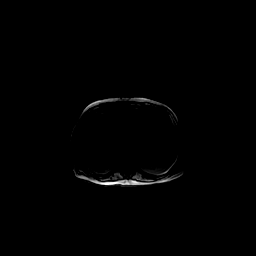
[im 98/98]
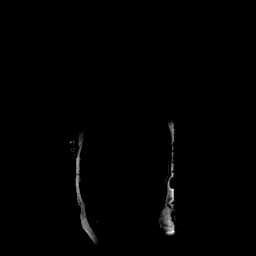

[((id)/(id)/1)-((id)/(id)/1) · axial · 4.4mm · 1.88mm/px · z∈[-74,+223]mm · 3 of 136 slices shown (1 of 4)]
[im 1/136]
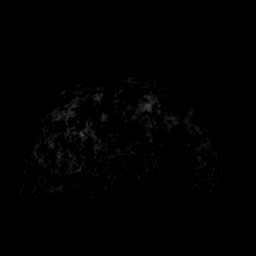
[im 68/136]
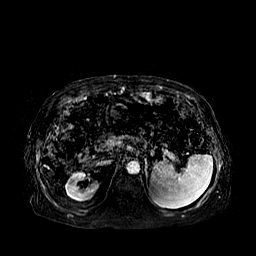
[im 136/136]
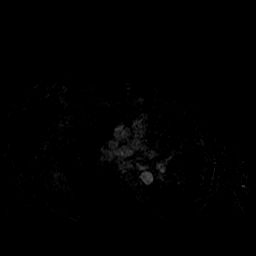

[((id)/(id)/1)-((id)/(id)/1) · axial · 4.4mm · 1.88mm/px · z∈[-74,+223]mm · 3 of 136 slices shown (2 of 4)]
[im 1/136]
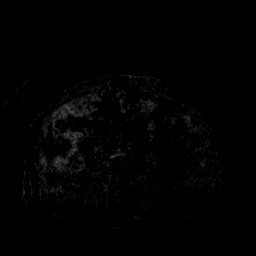
[im 68/136]
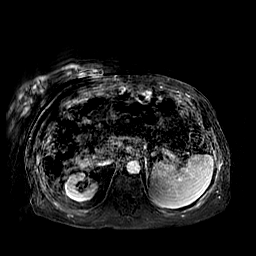
[im 136/136]
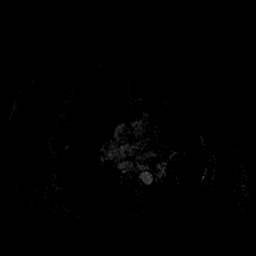

[((id)/(id)/1)-((id)/(id)/1) · axial · 4.4mm · 1.88mm/px · z∈[-74,+223]mm · 3 of 136 slices shown (3 of 4)]
[im 1/136]
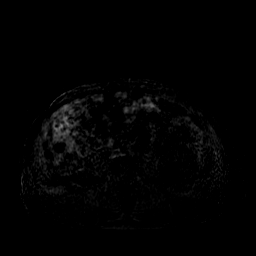
[im 68/136]
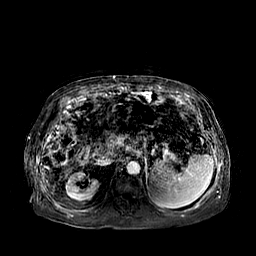
[im 136/136]
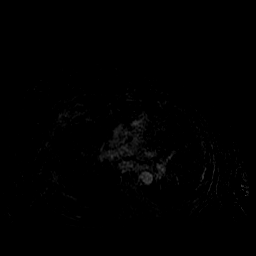

[((id)/(id)/1)-((id)/(id)/1) · axial · 4.4mm · 1.88mm/px · z∈[-74,+223]mm · 3 of 136 slices shown (4 of 4)]
[im 1/136]
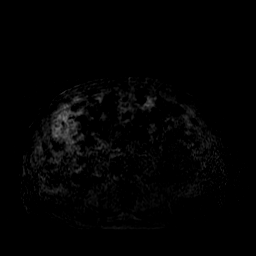
[im 68/136]
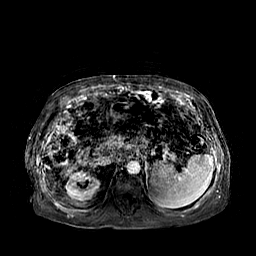
[im 136/136]
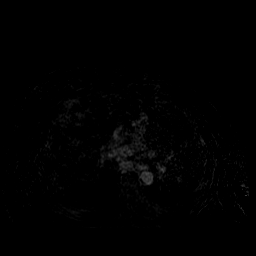

[33 of 48 positions shown; findings below may reference images not displayed]

FINDINGS: Motion degraded images. Postcontrast imaging is particularly
limited.

Lower chest: Lung bases are clear.

Hepatobiliary: Cirrhosis.

4.1 x 5.0 cm heterogeneously enhancing lesion at the junction of
segment 8 and 4A (series 6/ image 6). Additional 2.7 x 2.4 cm
enhancing lesion in segment 8 (series 6/ image 11). Enhancement
characteristics are difficult to characterize given the motion
degradation. Regardless, these are worrisome for multifocal HCC in
the setting of cirrhosis. Metastatic disease would be considered
less likely.

Status post cholecystectomy. No intrahepatic or extrahepatic ductal
dilatation.

Pancreas:  Poorly visualized.

Spleen: Mildly enlarged, measuring 13.9 cm in craniocaudal
dimension.

Adrenals/Urinary Tract:  Adrenal glands are grossly unremarkable.

Kidneys are within normal limits.  No hydronephrosis.

Stomach/Bowel: Stomach is within normal limits.

Mild wall thickening along the right colon, likely related to venous
congestion.

Vascular/Lymphatic:  No evidence of abdominal aortic aneurysm.

Gastroesophageal varices (series 6/images 10 and 20). Portal vein is
patent.

No suspicious abdominal lymphadenopathy.

Other:  Small volume abdominal ascites.

Musculoskeletal: No focal osseous lesions.
IMPRESSION: Motion degraded images. Postcontrast imaging is particularly
limited.

4.1 x 5.0 cm enhancing lesion at the junction of segment 8 and 4A.
Additional 2.7 x 2.4 cm enhancing lesion in segment 8. While poorly
evaluated, these findings are worrisome for multifocal HCC in the
setting of cirrhosis. Metastatic disease would be considered less
likely. Correlate with AFP.

Cirrhosis. Stigmata of portal hypertension, including
gastroesophageal varices, splenomegaly, and small volume abdominal
ascites.

Additional ancillary findings as above.
